# Patient Record
Sex: Male | Born: 1965 | Race: White | Hispanic: No | Marital: Married | Smoking: Never smoker
Health system: Southern US, Community
[De-identification: ages and names within clinical notes are randomized; demographics above are authoritative.]

## PROBLEM LIST (undated history)

## (undated) DIAGNOSIS — R011 Cardiac murmur, unspecified: Secondary | ICD-10-CM

## (undated) DIAGNOSIS — D126 Benign neoplasm of colon, unspecified: Secondary | ICD-10-CM

## (undated) DIAGNOSIS — M1990 Unspecified osteoarthritis, unspecified site: Secondary | ICD-10-CM

## (undated) DIAGNOSIS — Z87442 Personal history of urinary calculi: Secondary | ICD-10-CM

## (undated) DIAGNOSIS — N200 Calculus of kidney: Secondary | ICD-10-CM

## (undated) HISTORY — PX: POLYPECTOMY: SHX149

## (undated) HISTORY — PX: APPENDECTOMY: SHX54

## (undated) HISTORY — PX: WISDOM TOOTH EXTRACTION: SHX21

## (undated) HISTORY — PX: COLONOSCOPY: SHX174

## (undated) HISTORY — PX: KNEE ARTHROSCOPY: SUR90

## (undated) HISTORY — DX: Benign neoplasm of colon, unspecified: D12.6

## (undated) HISTORY — DX: Unspecified osteoarthritis, unspecified site: M19.90

## (undated) HISTORY — DX: Cardiac murmur, unspecified: R01.1

---

## 2004-12-29 ENCOUNTER — Encounter (INDEPENDENT_AMBULATORY_CARE_PROVIDER_SITE_OTHER): Payer: Self-pay | Admitting: General Surgery

## 2004-12-30 ENCOUNTER — Observation Stay (HOSPITAL_COMMUNITY): Admission: EM | Admit: 2004-12-30 | Discharge: 2004-12-31 | Payer: Self-pay | Admitting: Emergency Medicine

## 2008-01-03 ENCOUNTER — Emergency Department (HOSPITAL_COMMUNITY): Admission: EM | Admit: 2008-01-03 | Discharge: 2008-01-03 | Payer: Self-pay | Admitting: Emergency Medicine

## 2010-05-14 DIAGNOSIS — D126 Benign neoplasm of colon, unspecified: Secondary | ICD-10-CM

## 2010-05-14 HISTORY — DX: Benign neoplasm of colon, unspecified: D12.6

## 2010-07-24 ENCOUNTER — Encounter (INDEPENDENT_AMBULATORY_CARE_PROVIDER_SITE_OTHER): Payer: Self-pay | Admitting: *Deleted

## 2010-08-01 ENCOUNTER — Encounter (INDEPENDENT_AMBULATORY_CARE_PROVIDER_SITE_OTHER): Payer: Self-pay | Admitting: *Deleted

## 2010-08-01 NOTE — Letter (Signed)
Summary: Pre Visit Letter Revised  [facility]  [address] [place], [place] [address]   Phone: [phone]  Fax: [phone]        07/24/2010 MRN: [id number] [facility] [address] [place], [place]  [address]             Procedure Date:  September 04, 2010   dir col -Dr Jarold Motto   Welcome to the Gastroenterology Division at [facility].    You are scheduled to see a nurse for your pre-procedure visit on August 21, 2010 at 10:00am on the 3rd floor at [facility], [address]  We ask that you try to arrive at our office 15 minutes prior to your appointment time to allow for check-in.  Please take a minute to review the attached form.  If you answer "Yes" to one or more of the questions on the first page, we ask that you call the person listed at your earliest opportunity.  If you answer "No" to all of the questions, please complete the rest of the form and bring it to your appointment.    Your nurse visit will consist of discussing your medical and surgical history, your immediate family medical history, and your medications.   If you are unable to list all of your medications on the form, please bring the medication bottles to your appointment and we will list them.  We will need to be aware of both prescribed and over the counter drugs.  We will need to know exact dosage information as well.    Please be prepared to read and sign documents such as consent forms, a financial agreement, and acknowledgement forms.  If necessary, and with your consent, a friend or relative is welcome to sit-in on the nurse visit with you.  Please bring your insurance card so that we may make a copy of it.  If your insurance requires a referral to see a specialist, please bring your referral form from your primary care physician.  No co-pay is required for this nurse visit.     If you cannot keep your appointment, please call [phone] to cancel or reschedule prior to your  appointment date.  This allows [place] the opportunity to schedule an appointment for another patient in need of care.    Thank you for choosing [facility] for your medical needs.  We appreciate the opportunity to care for you.  Please visit [place] at our website  to learn more about our practice.  Sincerely, The Gastroenterology Division

## 2010-08-10 NOTE — Letter (Signed)
Summary: Pre Visit Letter Revised  [facility]  [address] [place], [place] [address]   Phone: [phone]  Fax: [phone]        08/01/2010 MRN: [id number] [facility] [address] [place], [place]  [address]             Procedure Date:  09-04-10           Direct Colon---Dr. Jarold Motto   Welcome to the Gastroenterology Division at [facility].    You are scheduled to see a nurse for your pre-procedure visit on 08-21-10 at 10:00a.m. on the 3rd floor at [facility], [address].  We ask that you try to arrive at our office 15 minutes prior to your appointment time to allow for check-in.  Please take a minute to review the attached form.  If you answer "Yes" to one or more of the questions on the first page, we ask that you call the person listed at your earliest opportunity.  If you answer "No" to all of the questions, please complete the rest of the form and bring it to your appointment.    Your nurse visit will consist of discussing your medical and surgical history, your immediate family medical history, and your medications.   If you are unable to list all of your medications on the form, please bring the medication bottles to your appointment and we will list them.  We will need to be aware of both prescribed and over the counter drugs.  We will need to know exact dosage information as well.    Please be prepared to read and sign documents such as consent forms, a financial agreement, and acknowledgement forms.  If necessary, and with your consent, a friend or relative is welcome to sit-in on the nurse visit with you.  Please bring your insurance card so that we may make a copy of it.  If your insurance requires a referral to see a specialist, please bring your referral form from your primary care physician.  No co-pay is required for this nurse visit.     If you cannot keep your appointment, please call [phone] to cancel or reschedule prior to  your appointment date.  This allows [place] the opportunity to schedule an appointment for another patient in need of care.    Thank you for choosing [facility] for your medical needs.  We appreciate the opportunity to care for you.  Please visit [place] at our website  to learn more about our practice.  Sincerely, The Gastroenterology Division

## 2010-08-10 NOTE — Letter (Signed)
Summary: Pre Visit Letter Revised  [facility]  [address] [place], [place] [address]   Phone: [phone]  Fax: [phone]        08/01/2010 MRN: [id number] [facility] [address] [place], [place]  [address]             Procedure Date: 09-04-10           Direct Colon---Dr. Jarold Motto   Welcome to the Gastroenterology Division at [facility].    You are scheduled to see a nurse for your pre-procedure visit on 08-21-10 at 10:00a.m. on the 3rd floor at [facility], [address].  We ask that you try to arrive at our office 15 minutes prior to your appointment time to allow for check-in.  Please take a minute to review the attached form.  If you answer "Yes" to one or more of the questions on the first page, we ask that you call the person listed at your earliest opportunity.  If you answer "No" to all of the questions, please complete the rest of the form and bring it to your appointment.    Your nurse visit will consist of discussing your medical and surgical history, your immediate family medical history, and your medications.   If you are unable to list all of your medications on the form, please bring the medication bottles to your appointment and we will list them.  We will need to be aware of both prescribed and over the counter drugs.  We will need to know exact dosage information as well.    Please be prepared to read and sign documents such as consent forms, a financial agreement, and acknowledgement forms.  If necessary, and with your consent, a friend or relative is welcome to sit-in on the nurse visit with you.  Please bring your insurance card so that we may make a copy of it.  If your insurance requires a referral to see a specialist, please bring your referral form from your primary care physician.  No co-pay is required for this nurse visit.     If you cannot keep your appointment, please call [phone] to cancel or reschedule prior to your  appointment date.  This allows [place] the opportunity to schedule an appointment for another patient in need of care.    Thank you for choosing [facility] for your medical needs.  We appreciate the opportunity to care for you.  Please visit [place] at our website  to learn more about our practice.  Sincerely, The Gastroenterology Division

## 2010-08-21 ENCOUNTER — Ambulatory Visit (AMBULATORY_SURGERY_CENTER): Payer: 59 | Admitting: *Deleted

## 2010-08-21 VITALS — Ht 70.0 in | Wt 214.6 lb

## 2010-08-21 DIAGNOSIS — Z1211 Encounter for screening for malignant neoplasm of colon: Secondary | ICD-10-CM

## 2010-08-21 MED ORDER — PEG-KCL-NACL-NASULF-NA ASC-C 100 G PO SOLR: ORAL | Status: DC

## 2010-09-01 ENCOUNTER — Encounter: Payer: Self-pay | Admitting: *Deleted

## 2010-09-04 ENCOUNTER — Encounter: Payer: Self-pay | Admitting: Gastroenterology

## 2010-09-04 ENCOUNTER — Ambulatory Visit (AMBULATORY_SURGERY_CENTER): Payer: 59 | Admitting: Gastroenterology

## 2010-09-04 DIAGNOSIS — D126 Benign neoplasm of colon, unspecified: Secondary | ICD-10-CM

## 2010-09-04 DIAGNOSIS — Z800 Family history of malignant neoplasm of digestive organs: Secondary | ICD-10-CM

## 2010-09-04 DIAGNOSIS — Z1211 Encounter for screening for malignant neoplasm of colon: Secondary | ICD-10-CM

## 2010-09-04 MED ORDER — SODIUM CHLORIDE 0.9 % IV SOLN: 500.0000 mL | INTRAVENOUS | Status: DC

## 2010-09-04 NOTE — Patient Instructions (Signed)
Follow discharge instructions.  Continue your medications with EXCEPTION of NSAIDS for 2 weeks. You may take Tylenol for pain.  Repeat Colonoscopy in one year.

## 2010-09-05 ENCOUNTER — Telehealth: Payer: Self-pay | Admitting: *Deleted

## 2010-09-05 NOTE — Telephone Encounter (Signed)

## 2010-09-08 ENCOUNTER — Encounter: Payer: Self-pay | Admitting: Gastroenterology

## 2010-09-29 NOTE — Op Note (Signed)
Jackson Carpenter, Jackson Carpenter              ACCOUNT NO.:  [id number]   MEDICAL RECORD NO.:  [id number]          PATIENT TYPE:  OBV   LOCATION:  A308                          FACILITY:  [facility]   PHYSICIAN:  Dalia Heading, M.D.  DATE OF BIRTH:  Jun 26, 1965   DATE OF PROCEDURE:  12/30/2004  DATE OF DISCHARGE:                                 OPERATIVE REPORT   PREOPERATIVE DIAGNOSIS:  Acute appendicitis.   POSTOPERATIVE DIAGNOSIS:  Acute appendicitis.   PROCEDURE:  Laparoscopic appendectomy.   SURGEON:  Dalia Heading, M.D.   ANESTHESIA:  General endotracheal.   INDICATIONS:  The patient is a 45 year old white male who presents with  right lower quadrant abdominal pain.  A CT scan confirmed acute  appendicitis.  The risks and benefits of the procedure including bleeding,  infection, and the possibility of an open procedure were fully explained to  the patient, who gave informed consent.   PROCEDURE NOTE:  The patient was placed in supine position.  After induction  of general endotracheal anesthesia, the abdomen was prepped and draped using  the usual sterile technique with Betadine.  Surgical site confirmation was  performed.   The patient was placed in Trendelenburg position.  A supraumbilical incision  was made down to the fascia.  A Veress needle was introduced into the  abdominal cavity and confirmation of placement was done using the saline  drop test. The abdomen was then insufflated to 16 mmHg pressure. An 11-mm  trocar was introduced into the abdominal cavity under direct visualization  without difficulty.  An additional 12-mm trocar was placed in suprapubic  region and a 5-mm trocar was placed in the left lower quadrant region.  The  appendix was visualized and noted be acutely inflamed in the distal third.  The mesoappendix was divided using the harmonic scalpel and Endo clips.  An  Endo-GIA was placed across the base of the appendix and fired.  The appendix  was removed using  an EndoCatch bag. The right lower quadrant was inspected.  No abnormal bleeding or bile leak or spillage was noted.  The staple line  was noted be intact.  All fluid and air were then evacuated from the  abdominal cavity prior to removal of the trocars.   All wounds were irrigated with normal saline.  All wounds were injected with  0.5 cm Sensorcaine.  The supraumbilical fascia as well as suprapubic fascia  were reapproximated using O Vicryl interrupted sutures.  All skin incisions  were closed using staples. Betadine ointment and dry sterile dressings were  applied.   All tape and needle counts were correct and the end of the procedure.  The  patient was extubated in the operating room and went back to recovery room  awake and in stable condition.   COMPLICATIONS:  None.   SPECIMEN:  Appendix.   BLOOD LOSS:  Minimal.      Dalia Heading, M.D.  Electronically Signed     MAJ/MEDQ  D:  12/30/2004  T:  12/30/2004  Job:  [id number]   cc:   Kirk Ruths, M.D.  [address]  [place]  [place] [address]  Fax: [phone]

## 2011-08-08 ENCOUNTER — Encounter: Payer: Self-pay | Admitting: Gastroenterology

## 2011-08-13 ENCOUNTER — Encounter: Payer: Self-pay | Admitting: Gastroenterology

## 2011-09-10 ENCOUNTER — Encounter: Payer: Self-pay | Admitting: Gastroenterology

## 2011-09-10 ENCOUNTER — Ambulatory Visit (AMBULATORY_SURGERY_CENTER): Payer: BC Managed Care – PPO | Admitting: *Deleted

## 2011-09-10 VITALS — Ht 70.0 in | Wt 218.0 lb

## 2011-09-10 DIAGNOSIS — Z1211 Encounter for screening for malignant neoplasm of colon: Secondary | ICD-10-CM

## 2011-09-10 DIAGNOSIS — D126 Benign neoplasm of colon, unspecified: Secondary | ICD-10-CM

## 2011-09-10 MED ORDER — PEG-KCL-NACL-NASULF-NA ASC-C 100 G PO SOLR: ORAL | Status: DC

## 2011-09-24 ENCOUNTER — Encounter: Payer: Self-pay | Admitting: Gastroenterology

## 2011-09-24 ENCOUNTER — Ambulatory Visit (AMBULATORY_SURGERY_CENTER): Payer: BC Managed Care – PPO | Admitting: Gastroenterology

## 2011-09-24 VITALS — BP 144/76 | HR 64 | Temp 96.6°F | Resp 15 | Ht 70.0 in | Wt 218.0 lb

## 2011-09-24 DIAGNOSIS — Z1211 Encounter for screening for malignant neoplasm of colon: Secondary | ICD-10-CM

## 2011-09-24 DIAGNOSIS — D126 Benign neoplasm of colon, unspecified: Secondary | ICD-10-CM

## 2011-09-24 DIAGNOSIS — Z86010 Personal history of colonic polyps: Secondary | ICD-10-CM

## 2011-09-24 MED ORDER — SODIUM CHLORIDE 0.9 % IV SOLN: 500.0000 mL | INTRAVENOUS | Status: DC

## 2011-09-24 NOTE — Patient Instructions (Signed)
YOU HAD AN ENDOSCOPIC PROCEDURE TODAY AT THE [facility]: Refer to the procedure report that was given to you for any specific questions about what was found during the examination.  If the procedure report does not answer your questions, please call your gastroenterologist to clarify.  If you requested that your care partner not be given the details of your procedure findings, then the procedure report has been included in a sealed envelope for you to review at your convenience later.  YOU SHOULD EXPECT: Some feelings of bloating in the abdomen. Passage of more gas than usual.  Walking can help get rid of the air that was put into your GI tract during the procedure and reduce the bloating. If you had a lower endoscopy (such as a colonoscopy or flexible sigmoidoscopy) you may notice spotting of blood in your stool or on the toilet paper. If you underwent a bowel prep for your procedure, then you may not have a normal bowel movement for a few days.  DIET: Your first meal following the procedure should be a light meal and then it is ok to progress to your normal diet.  A half-sandwich or bowl of soup is an example of a good first meal.  Heavy or fried foods are harder to digest and may make you feel nauseous or bloated.  Likewise meals heavy in dairy and vegetables can cause extra gas to form and this can also increase the bloating.  Drink plenty of fluids but you should avoid alcoholic beverages for 24 hours.  ACTIVITY: Your care partner should take you home directly after the procedure.  You should plan to take it easy, moving slowly for the rest of the day.  You can resume normal activity the day after the procedure however you should NOT DRIVE or use heavy machinery for 24 hours (because of the sedation medicines used during the test).    SYMPTOMS TO REPORT IMMEDIATELY: A gastroenterologist can be reached at any hour.  During normal business hours, 8:30 AM to 5:00 PM Monday through Friday,  call [phone].  After hours and on weekends, please call the GI answering service at [phone] who will take a message and have the physician on call contact you.   Following lower endoscopy (colonoscopy or flexible sigmoidoscopy):  Excessive amounts of blood in the stool  Significant tenderness or worsening of abdominal pains  Swelling of the abdomen that is new, acute  Fever of 100F or higher  Following upper endoscopy (EGD)  Vomiting of blood or coffee ground material  New chest pain or pain under the shoulder blades  Painful or persistently difficult swallowing  New shortness of breath  Fever of 100F or higher  Black, tarry-looking stools  FOLLOW UP: If any biopsies were taken you will be contacted by phone or by letter within the next 1-3 weeks.  Call your gastroenterologist if you have not heard about the biopsies in 3 weeks.  Our staff will call the home number listed on your records the next business day following your procedure to check on you and address any questions or concerns that you may have at that time regarding the information given to you following your procedure. This is a courtesy call and so if there is no answer at the home number and we have not heard from you through the emergency physician on call, we will assume that you have returned to your regular daily activities without incident.  SIGNATURES/CONFIDENTIALITY: You and/or your care   partner have signed paperwork which will be entered into your electronic medical record.  These signatures attest to the fact that that the information above on your After Visit Summary has been reviewed and is understood.  Full responsibility of the confidentiality of this discharge information lies with you and/or your care-partner.   REPEAT COLONOSCOPY IN 5 YEARS 

## 2011-09-24 NOTE — Progress Notes (Signed)
Patient did not experience any of the following events: a burn prior to discharge; a fall within the facility; wrong site/side/patient/procedure/implant event; or a hospital transfer or hospital admission upon discharge from the facility. (G8907) Patient did not have preoperative order for IV antibiotic SSI prophylaxis. (G8918)  

## 2011-09-24 NOTE — Progress Notes (Signed)
Pressure applied to the abdomen during withdrawal of scope for better visualization.

## 2011-09-24 NOTE — Op Note (Signed)
[facility] [address] [place], [place]  [address]  COLONOSCOPY PROCEDURE REPORT  PATIENT:  Jackson Carpenter, Jackson Carpenter  MR#:  [id number] BIRTHDATE:  03-19-1966, 45 yrs. old  GENDER:  male ENDOSCOPIST:  Vania Rea. Jarold Motto, MD, [facility] REF. BY: PROCEDURE DATE:  09/24/2011 PROCEDURE:  Diagnostic Colonoscopy ASA CLASS:  Class II INDICATIONS:  history of pre-cancerous (adenomatous) colon polyps large right colon polyp removed 1 year ago. MEDICATIONS:   propofol (Diprivan) 300 mg IV  DESCRIPTION OF PROCEDURE:   After the risks and benefits and of the procedure were explained, informed consent was obtained. Digital rectal exam was performed and revealed no abnormalities. The LB CF-[id number] endoscope was introduced through the anus and advanced to the cecum, which was identified by both the appendix and ileocecal valve.  The quality of the prep was excellent, using MoviPrep.  The instrument was then slowly withdrawn as the colon was fully examined. <<PROCEDUREIMAGES>>  FINDINGS:  No polyps or cancers were seen.  This was otherwise a normal examination of the colon. WELL PREPPED RIGHT COLON APPEARS NORMAL,,,   Retroflexed views in the rectum revealed no abnormalities.    The scope was then withdrawn from the patient and the procedure completed.  COMPLICATIONS:  None ENDOSCOPIC IMPRESSION: 1) No polyps or cancers 2) Otherwise normal examination RECOMMENDATIONS: 1) Repeat Colonoscopy in 5 years.  REPEAT EXAM:  No  ______________________________ Vania Rea. Jarold Motto, MD, Clement[facility] Graham  CC:  Karleen Hampshire, MD  n. Rosalie DoctorMarland Kitchen   Vania Rea. Laurelai Lepp at 09/24/2011 09:44 AM  Isabel Caprice, [phone]

## 2011-09-25 ENCOUNTER — Telehealth: Payer: Self-pay | Admitting: *Deleted

## 2011-09-25 NOTE — Telephone Encounter (Signed)
  Follow up Call-  Call back number 09/24/2011 09/04/2010  Post procedure Call Back phone  # [phone]  Permission to leave phone message Yes -     Patient questions:  Do you have a fever, pain , or abdominal swelling? no Pain Score  0 *  Have you tolerated food without any problems? yes  Have you been able to return to your normal activities? yes  Do you have any questions about your discharge instructions: Diet   no Medications  no Follow up visit  no  Do you have questions or concerns about your Care? no  Actions: * If pain score is 4 or above: No action needed, pain <4.

## 2014-03-22 ENCOUNTER — Other Ambulatory Visit (HOSPITAL_COMMUNITY): Payer: Self-pay | Admitting: Nurse Practitioner

## 2014-03-22 ENCOUNTER — Ambulatory Visit (HOSPITAL_COMMUNITY)
Admission: RE | Admit: 2014-03-22 | Discharge: 2014-03-22 | Disposition: A | Discharge status: Home / Self Care | Payer: BC Managed Care – PPO | Source: Ambulatory Visit | Attending: Nurse Practitioner | Admitting: Nurse Practitioner

## 2014-03-22 DIAGNOSIS — M545 Low back pain, unspecified: Secondary | ICD-10-CM

## 2014-03-22 DIAGNOSIS — M79604 Pain in right leg: Secondary | ICD-10-CM

## 2015-11-20 ENCOUNTER — Emergency Department (HOSPITAL_COMMUNITY): Payer: Worker's Compensation

## 2015-11-20 ENCOUNTER — Encounter (HOSPITAL_COMMUNITY): Payer: Self-pay | Admitting: *Deleted

## 2015-11-20 ENCOUNTER — Emergency Department (HOSPITAL_COMMUNITY)
Admission: EM | Admit: 2015-11-20 | Discharge: 2015-11-20 | Disposition: A | Discharge status: Home / Self Care | Payer: Worker's Compensation | Attending: Emergency Medicine | Admitting: Emergency Medicine

## 2015-11-20 DIAGNOSIS — Z7982 Long term (current) use of aspirin: Secondary | ICD-10-CM | POA: Insufficient documentation

## 2015-11-20 DIAGNOSIS — M549 Dorsalgia, unspecified: Secondary | ICD-10-CM | POA: Diagnosis present

## 2015-11-20 DIAGNOSIS — S20221A Contusion of right back wall of thorax, initial encounter: Secondary | ICD-10-CM | POA: Diagnosis not present

## 2015-11-20 DIAGNOSIS — Y9389 Activity, other specified: Secondary | ICD-10-CM | POA: Insufficient documentation

## 2015-11-20 DIAGNOSIS — S39011D Strain of muscle, fascia and tendon of abdomen, subsequent encounter: Secondary | ICD-10-CM | POA: Insufficient documentation

## 2015-11-20 DIAGNOSIS — S20211A Contusion of right front wall of thorax, initial encounter: Secondary | ICD-10-CM

## 2015-11-20 DIAGNOSIS — S4991XA Unspecified injury of right shoulder and upper arm, initial encounter: Secondary | ICD-10-CM

## 2015-11-20 DIAGNOSIS — S46901A Unspecified injury of unspecified muscle, fascia and tendon at shoulder and upper arm level, right arm, initial encounter: Secondary | ICD-10-CM | POA: Diagnosis not present

## 2015-11-20 DIAGNOSIS — Y929 Unspecified place or not applicable: Secondary | ICD-10-CM | POA: Insufficient documentation

## 2015-11-20 DIAGNOSIS — W1839XA Other fall on same level, initial encounter: Secondary | ICD-10-CM | POA: Insufficient documentation

## 2015-11-20 DIAGNOSIS — Y999 Unspecified external cause status: Secondary | ICD-10-CM | POA: Insufficient documentation

## 2015-11-20 DIAGNOSIS — F17220 Nicotine dependence, chewing tobacco, uncomplicated: Secondary | ICD-10-CM | POA: Insufficient documentation

## 2015-11-20 DIAGNOSIS — S76211D Strain of adductor muscle, fascia and tendon of right thigh, subsequent encounter: Secondary | ICD-10-CM

## 2015-11-20 MED ORDER — ACETAMINOPHEN 325 MG PO TABS
650.0000 mg | ORAL_TABLET | Freq: Once | ORAL | Status: AC
  Administered 2015-11-20: 650 mg via ORAL
  Filled 2015-11-20: qty 2

## 2015-11-20 MED ORDER — OXYCODONE-ACETAMINOPHEN 5-325 MG PO TABS: 1.0000 | ORAL_TABLET | ORAL | Status: DC | PRN

## 2015-11-20 NOTE — ED Notes (Signed)
Upon assessment, pt is having pain in his right leg as well.

## 2015-11-20 NOTE — ED Provider Notes (Signed)
CSN: [id number]     Arrival date & time 11/20/15  1445 History   First MD Initiated Contact with Patient 11/20/15 1500     Chief Complaint  Patient presents with  . Shoulder Pain     (Consider location/radiation/quality/duration/timing/severity/associated sxs/prior Treatment) HPI  50 year old male presents with back pain on the right side as well as right thigh pain after being knocked over by a horse. He was helping a friend put a horse up on one of them charged at him and knocked him over. He did not get trampled or stomped on. Patient states he hit the back of his head when he fell but denies any loss of consciousness and has no headache. No neck pain or stiffness. No nausea or vomiting or blurry vision. He's having pain in between his shoulder blade and midline back on the right. Also having right thigh/groin pain. Denies chest or abdominal pain. Rates his pain as a 6/10, declines pain medicine at this time.  Past Medical History  Diagnosis Date  . Heart murmur   . Adenomatous colon polyp 2012   Past Surgical History  Procedure Laterality Date  . Appendectomy    . Knee arthroscopy      left  . Wisdom tooth extraction     Family History  Problem Relation Age of Onset  . Colon cancer Mother   . Colon polyps Father    Social History  Substance Use Topics  . Smoking status: Never Smoker   . Smokeless tobacco: Current User    Types: Chew  . Alcohol Use: No    Review of Systems  Respiratory: Negative for shortness of breath.   Cardiovascular: Negative for chest pain.  Gastrointestinal: Negative for abdominal pain.  Musculoskeletal: Positive for back pain and arthralgias. Negative for neck pain.  Neurological: Negative for dizziness, syncope, weakness, numbness and headaches.  All other systems reviewed and are negative.     Allergies  Review of patient's allergies indicates no known allergies.  Home Medications   Prior to Admission medications   Medication Sig  Start Date End Date Taking? Authorizing Provider  Aspirin-Acetaminophen-Caffeine (GOODY HEADACHE PO) Take by mouth. As needed for headache     Historical Provider, MD  ibuprofen (ADVIL,MOTRIN) 200 MG tablet Take 400 mg by mouth every 6 (six) hours as needed.    Historical Provider, MD  naproxen sodium (ANAPROX) 220 MG tablet Take 220 mg by mouth as needed.    Historical Provider, MD   BP 135/90 mmHg  Pulse 74  Temp(Src) 97.9 F (36.6 C) (Oral)  Resp 16  SpO2 98% Physical Exam  Constitutional: He is oriented to person, place, and time. He appears well-developed and well-nourished. Cervical collar in place.  HENT:  Head: Normocephalic and atraumatic.  Right Ear: External ear normal.  Left Ear: External ear normal.  Nose: Nose normal.  No obvious head injury noted  Eyes: EOM are normal. Right eye exhibits no discharge. Left eye exhibits no discharge.  Neck: Neck supple. No spinous process tenderness and no muscular tenderness present.  Cardiovascular: Normal rate, regular rhythm, normal heart sounds and intact distal pulses.   Pulses:      Radial pulses are 2+ on the right side, and 2+ on the left side.       Dorsalis pedis pulses are 2+ on the right side, and 2+ on the left side.  Pulmonary/Chest: Effort normal and breath sounds normal.  Abdominal: Soft. There is no tenderness.  Musculoskeletal: He exhibits no edema.  Right hip: He exhibits tenderness. He exhibits normal range of motion.       Cervical back: He exhibits no tenderness.       Thoracic back: He exhibits tenderness. He exhibits no bony tenderness.       Lumbar back: He exhibits no tenderness.       Back:       Right upper leg: He exhibits tenderness (proximal).  Has tenderness over right hip and proximal thigh. However, he can bend knee and hip. Significant pain in thigh when trying to straight leg raise  Neurological: He is alert and oriented to person, place, and time.  Skin: Skin is warm and dry.  Nursing note  and vitals reviewed.   ED Course  Procedures (including critical care time) Labs Review Labs Reviewed - No data to display  Imaging Review Dg Ribs Unilateral W/chest Right  11/20/2015  CLINICAL DATA:  Fall, right rib pain EXAM: RIGHT RIBS AND CHEST - 3+ VIEW COMPARISON:  None. FINDINGS: Lungs are clear.  No pleural effusion or pneumothorax. The heart is normal in size. No displaced right rib fracture is seen. Notably, the right scapula appears intact on these radiographs. IMPRESSION: No evidence of acute cardiopulmonary disease. No displaced right rib fracture is seen. Right scapula appears intact. Electronically Signed   By: Julian Hy M.D.   On: 11/20/2015 17:16   Dg Thoracic Spine 2 View  11/20/2015  CLINICAL DATA:  Back pain, knocked over by horse EXAM: THORACIC SPINE 2 VIEWS COMPARISON:  None. FINDINGS: Normal thoracic kyphosis. No evidence of fracture or dislocation. Vertebral body heights are maintained. Moderate multilevel degenerative changes. Visualized lungs are clear. IMPRESSION: No fracture or dislocation is seen. Electronically Signed   By: Julian Hy M.D.   On: 11/20/2015 16:08   Dg Scapula Right  11/20/2015  ADDENDUM REPORT: 11/20/2015 17:21 ADDENDUM: This patient has a subsequent chest/right rib series study performed. On that study, the right scapula is well visualized and appears intact. As such, the appearance on this study is likely related to artifact. This could be confirmed by assessing for point tenderness over the scapula. ADDENDED IMPRESSION: Lucency overlying the medial right scapular is favored to reflect artifact rather than a fracture. Correlate for point tenderness. No evidence of fracture or dislocation. Electronically Signed   By: Julian Hy M.D.   On: 11/20/2015 17:21  11/20/2015  CLINICAL DATA:  Right posterior shoulder/ scapular pain, knocked over by horse EXAM: RIGHT SCAPULA - 2+ VIEWS COMPARISON:  None. FINDINGS: Mildly comminuted fracture  involving the medial scapula, predominantly vertical in orientation. The visualized soft tissues are unremarkable. Visualized right lung is clear. IMPRESSION: Mildly comminuted medial scapular fracture, as above. Electronically Signed: By: Julian Hy M.D. On: 11/20/2015 16:10   Dg Hip Unilat With Pelvis 2-3 Views Right  11/20/2015  CLINICAL DATA:  Right hip pain, knocked over by horse EXAM: DG HIP (WITH OR WITHOUT PELVIS) 2-3V RIGHT COMPARISON:  None. FINDINGS: No fracture or dislocation is seen. Bilobed hip joint spaces are preserved. Visualized bony pelvis appears intact. Lower lumbar spine is unremarkable. Surgical clips overlying the right lower pelvis/iliac crest. IMPRESSION: No fracture or dislocation is seen. Electronically Signed   By: Julian Hy M.D.   On: 11/20/2015 16:10   Dg Femur, Min 2 Views Right  11/20/2015  CLINICAL DATA:  Right hip pain, knocked over by horse EXAM: RIGHT FEMUR 2 VIEWS COMPARISON:  None. FINDINGS: No fracture or dislocation is seen. The joint spaces are preserved.  Visualized soft tissues are within normal limits. IMPRESSION: No fracture or dislocation is seen. Electronically Signed   By: Julian Hy M.D.   On: 11/20/2015 16:11   I have personally reviewed and evaluated these images and lab results as part of my medical decision-making.   EKG Interpretation None      MDM   Final diagnoses:  Injury of right scapular region, initial encounter  Groin strain, right, subsequent encounter  Rib contusion, right, initial encounter    Patient x-rays show possible scapula fracture but then second x-ray of his ribs axis shows no scapula fracture. Patient is point tender over his scapula on reexamination with a bruise and thus will treat conservatively with a sling. Discussed with Dr. Lorin Mercy of Ortho who will follow-up the patient and outpatient setting. No other signs of severe injury and he has no abdominal tenderness. No obvious rib fracture. Will  discharge home with pain control and recommend follow-up with ortho. No hip fracture, and he is able to ambulate without assistance.    Sherwood Gambler, MD 11/21/15 0005

## 2015-11-20 NOTE — ED Notes (Signed)
MD at bedside. 

## 2015-11-20 NOTE — Discharge Instructions (Signed)
Your Xrays show a possible fracture of your Scapula (shoulder blade) but also look like it might not be broken. Wear the sling just in case and follow up with the orthopedic surgeon in 1 week. Take ibuprofen, tylenol and use ice on the painful areas.

## 2015-11-20 NOTE — ED Notes (Signed)
Pt come in with right posterior shoulder pain. 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 This caused him to fall back on his right shoulder and posterior head. Pt is alert and oriented upon arrival. Pt was log rolled with 2 person assist and denied any tenderness in his back or neck.

## 2016-05-14 HISTORY — PX: COLONOSCOPY: SHX174

## 2016-08-09 ENCOUNTER — Encounter: Payer: Self-pay | Admitting: Gastroenterology

## 2016-09-17 ENCOUNTER — Encounter: Payer: Self-pay | Admitting: Gastroenterology

## 2016-10-05 ENCOUNTER — Ambulatory Visit (AMBULATORY_SURGERY_CENTER): Payer: Self-pay | Admitting: *Deleted

## 2016-10-05 VITALS — Ht 70.0 in | Wt 232.0 lb

## 2016-10-05 DIAGNOSIS — Z800 Family history of malignant neoplasm of digestive organs: Secondary | ICD-10-CM

## 2016-10-05 DIAGNOSIS — Z86010 Personal history of colonic polyps: Secondary | ICD-10-CM

## 2016-10-05 MED ORDER — NA SULFATE-K SULFATE-MG SULF 17.5-3.13-1.6 GM/177ML PO SOLN: 1.0000 | Freq: Once | ORAL | 0 refills | Status: AC

## 2016-10-05 NOTE — Progress Notes (Signed)
No egg or soy allergy known to patient  No issues with past sedation with any surgeries  or procedures, no intubation problems  No diet pills per patient No home 02 use per patient  No blood thinners per patient  Pt denies issues with constipation  No A fib or A flutter  EMMI video declined  15 $ coupon to pt for suprep

## 2016-10-16 ENCOUNTER — Encounter: Payer: Self-pay | Admitting: Gastroenterology

## 2016-10-26 ENCOUNTER — Encounter: Payer: Self-pay | Admitting: Gastroenterology

## 2016-10-26 ENCOUNTER — Ambulatory Visit (AMBULATORY_SURGERY_CENTER): Payer: BLUE CROSS/BLUE SHIELD | Admitting: Gastroenterology

## 2016-10-26 VITALS — BP 110/56 | HR 53 | Temp 98.6°F | Resp 16 | Ht 70.0 in | Wt 232.0 lb

## 2016-10-26 DIAGNOSIS — Z86010 Personal history of colonic polyps: Secondary | ICD-10-CM

## 2016-10-26 DIAGNOSIS — D122 Benign neoplasm of ascending colon: Secondary | ICD-10-CM | POA: Diagnosis not present

## 2016-10-26 DIAGNOSIS — D123 Benign neoplasm of transverse colon: Secondary | ICD-10-CM | POA: Diagnosis not present

## 2016-10-26 MED ORDER — SODIUM CHLORIDE 0.9 % IV SOLN: 500.0000 mL | INTRAVENOUS | Status: AC

## 2016-10-26 NOTE — Op Note (Signed)
[facility] Patient Name: Jackson Carpenter Procedure Date: 10/26/2016 9:15 AM MRN: [id number] Endoscopist: Jackson Lipps P. Ryo Klang MD, MD Age: 51 Referring MD:  Date of Birth: 1965/10/16 Gender: Male Account #: [id number] Procedure:                Colonoscopy Indications:              Surveillance: Personal history of adenomatous                            polyps on last colonoscopy > 3 years ago, mother                            with colon cancer diagnosed age 63s Medicines:                Monitored Anesthesia Care Procedure:                Pre-Anesthesia Assessment:                           - Prior to the procedure, a History and Physical                            was performed, and patient medications and                            allergies were reviewed. The patient's tolerance of                            previous anesthesia was also reviewed. The risks                            and benefits of the procedure and the sedation                            options and risks were discussed with the patient.                            All questions were answered, and informed consent                            was obtained. Prior Anticoagulants: The patient has                            taken no previous anticoagulant or antiplatelet                            agents. ASA Grade Assessment: II - A patient with                            mild systemic disease. After reviewing the risks                            and benefits, the patient was deemed in  satisfactory condition to undergo the procedure.                           After obtaining informed consent, the colonoscope                            was passed under direct vision. Throughout the                            procedure, the patient's blood pressure, pulse, and                            oxygen saturations were monitored continuously. The                            Colonoscope was  introduced through the anus and                            advanced to the the terminal ileum, with                            identification of the appendiceal orifice and IC                            valve. The colonoscopy was performed without                            difficulty. The patient tolerated the procedure                            well. The quality of the bowel preparation was                            good. The terminal ileum, ileocecal valve,                            appendiceal orifice, and rectum were photographed. Scope In: 9:16:58 AM Scope Out: 9:38:01 AM Scope Withdrawal Time: 0 hours 18 minutes 15 seconds  Total Procedure Duration: 0 hours 21 minutes 3 seconds  Findings:                 The perianal and digital rectal examinations were                            normal.                           A 5 mm polyp was found in the ascending colon. The                            polyp was sessile. The polyp was removed with a                            cold snare. Resection and retrieval were complete.  Five sessile polyps were found in the transverse                            colon. The polyps were 3 to 5 mm in size. These                            polyps were removed with a cold snare. Resection                            and retrieval were complete.                           The terminal ileum appeared normal.                           The exam was otherwise without abnormality on                            direct and retroflexion views. Complications:            No immediate complications. Estimated blood loss:                            Minimal. Estimated Blood Loss:     Estimated blood loss was minimal. Impression:               - One 5 mm polyp in the ascending colon, removed                            with a cold snare. Resected and retrieved.                           - Five 3 to 5 mm polyps in the transverse colon,                             removed with a cold snare. Resected and retrieved.                           - The examined portion of the ileum was normal.                           - The examination was otherwise normal on direct                            and retroflexion views. Recommendation:           - Patient has a contact number available for                            emergencies. The signs and symptoms of potential                            delayed complications were discussed with the  patient. Return to normal activities tomorrow.                            Written discharge instructions were provided to the                            patient.                           - Resume previous diet.                           - Continue present medications.                           - Await pathology results.                           - Repeat colonoscopy is recommended for                            surveillance. The colonoscopy date will be                            determined after pathology results from today's                            exam become available for review.                           - No ibuprofen, naproxen, or other non-steroidal                            anti-inflammatory drugs for 2 weeks after polyp                            removal. Jackson Lipps P. Jackson Burdell MD, MD 10/26/2016 9:42:32 AM This report has been signed electronically.

## 2016-10-26 NOTE — Patient Instructions (Signed)
YOU HAD AN ENDOSCOPIC PROCEDURE TODAY AT [facility]:   Refer to the procedure report that was given to you for any specific questions about what was found during the examination.  If the procedure report does not answer your questions, please call your gastroenterologist to clarify.  If you requested that your care partner not be given the details of your procedure findings, then the procedure report has been included in a sealed envelope for you to review at your convenience later.  YOU SHOULD EXPECT: Some feelings of bloating in the abdomen. Passage of more gas than usual.  Walking can help get rid of the air that was put into your GI tract during the procedure and reduce the bloating. If you had a lower endoscopy (such as a colonoscopy or flexible sigmoidoscopy) you may notice spotting of blood in your stool or on the toilet paper. If you underwent a bowel prep for your procedure, you may not have a normal bowel movement for a few days.  Please Note:  You might notice some irritation and congestion in your nose or some drainage.  This is from the oxygen used during your procedure.  There is no need for concern and it should clear up in a day or so.  SYMPTOMS TO REPORT IMMEDIATELY:   Following lower endoscopy (colonoscopy or flexible sigmoidoscopy):  Excessive amounts of blood in the stool  Significant tenderness or worsening of abdominal pains  Swelling of the abdomen that is new, acute  Fever of 100F or higher   For urgent or emergent issues, a gastroenterologist can be reached at any hour by calling [phone].   DIET:  We do recommend a small meal at first, but then you may proceed to your regular diet.  Drink plenty of fluids but you should avoid alcoholic beverages for 24 hours.  ACTIVITY:  You should plan to take it easy for the rest of today and you should NOT DRIVE or use heavy machinery until tomorrow (because of the sedation medicines used during the test).     FOLLOW UP: Our staff will call the number listed on your records the next business day following your procedure to check on you and address any questions or concerns that you may have regarding the information given to you following your procedure. If we do not reach you, we will leave a message.  However, if you are feeling well and you are not experiencing any problems, there is no need to return our call.  We will assume that you have returned to your regular daily activities without incident.  If any biopsies were taken you will be contacted by phone or by letter within the next 1-3 weeks.  Please call us at [phone] if you have not heard about the biopsies in 3 weeks.   \ SIGNATURES/CONFIDENTIALITY: You and/or your care partner have signed paperwork which will be entered into your electronic medical record.  These signatures attest to the fact that that the information above on your After Visit Summary has been reviewed and is understood.  Full responsibility of the confidentiality of this discharge information lies with you and/or your care-partner.  Polyp information given.  NO  NSAIDS (IBUPROFEN, ADVIL, ALEVE, AND MOTRIN) FOR 2 weeks; TYLENOL IS OK TO TAKE

## 2016-10-26 NOTE — Progress Notes (Signed)
Called to room to assist during endoscopic procedure.  Patient ID and intended procedure confirmed with present staff. Received instructions for my participation in the procedure from the performing physician.  

## 2016-10-26 NOTE — Progress Notes (Signed)
Alert and oriented x3, pleased with MAC, report to RN jane 

## 2016-10-26 NOTE — Progress Notes (Signed)
Pt's states no medical or surgical changes since previsit or office visit. 

## 2016-10-29 ENCOUNTER — Telehealth: Payer: Self-pay | Admitting: *Deleted

## 2016-10-29 NOTE — Telephone Encounter (Signed)
  Follow up Call-  Call back number 10/26/2016  Post procedure Call Back phone  # [phone]  Permission to leave phone message Yes  Some recent data might be hidden     Patient questions:  Do you have a fever, pain , or abdominal swelling? No. Pain Score  0 *  Have you tolerated food without any problems? Yes.    Have you been able to return to your normal activities? Yes.    Do you have any questions about your discharge instructions: Diet   No. Medications  No. Follow up visit  No.  Do you have questions or concerns about your Care? No.  Actions: * If pain score is 4 or above: No action needed, pain <4.

## 2016-11-02 ENCOUNTER — Encounter: Payer: Self-pay | Admitting: Gastroenterology

## 2016-12-16 ENCOUNTER — Emergency Department (HOSPITAL_COMMUNITY)
Admission: EM | Admit: 2016-12-16 | Discharge: 2016-12-16 | Disposition: A | Discharge status: Home / Self Care | Payer: Commercial Managed Care - PPO | Attending: Emergency Medicine | Admitting: Emergency Medicine

## 2016-12-16 ENCOUNTER — Emergency Department (HOSPITAL_COMMUNITY): Payer: Commercial Managed Care - PPO

## 2016-12-16 ENCOUNTER — Encounter (HOSPITAL_COMMUNITY): Payer: Self-pay | Admitting: *Deleted

## 2016-12-16 DIAGNOSIS — N201 Calculus of ureter: Secondary | ICD-10-CM | POA: Insufficient documentation

## 2016-12-16 DIAGNOSIS — R1032 Left lower quadrant pain: Secondary | ICD-10-CM | POA: Diagnosis present

## 2016-12-16 DIAGNOSIS — F17290 Nicotine dependence, other tobacco product, uncomplicated: Secondary | ICD-10-CM | POA: Insufficient documentation

## 2016-12-16 DIAGNOSIS — R911 Solitary pulmonary nodule: Secondary | ICD-10-CM | POA: Insufficient documentation

## 2016-12-16 LAB — CBC
HCT: 40.8 % (ref 39.0–52.0)
HEMOGLOBIN: 14.1 g/dL (ref 13.0–17.0)
MCH: 30.5 pg (ref 26.0–34.0)
MCHC: 34.6 g/dL (ref 30.0–36.0)
MCV: 88.1 fL (ref 78.0–100.0)
PLATELETS: 155 10*3/uL (ref 150–400)
RBC: 4.63 MIL/uL (ref 4.22–5.81)
RDW: 13.2 % (ref 11.5–15.5)
WBC: 6.7 10*3/uL (ref 4.0–10.5)

## 2016-12-16 LAB — COMPREHENSIVE METABOLIC PANEL
ALK PHOS: 77 U/L (ref 38–126)
ALT: 62 U/L (ref 17–63)
AST: 42 U/L — ABNORMAL HIGH (ref 15–41)
Albumin: 3.9 g/dL (ref 3.5–5.0)
Anion gap: 7 (ref 5–15)
BUN: 10 mg/dL (ref 6–20)
CALCIUM: 9.2 mg/dL (ref 8.9–10.3)
CO2: 25 mmol/L (ref 22–32)
CREATININE: 1.06 mg/dL (ref 0.61–1.24)
Chloride: 108 mmol/L (ref 101–111)
Glucose, Bld: 118 mg/dL — ABNORMAL HIGH (ref 65–99)
Potassium: 3.8 mmol/L (ref 3.5–5.1)
SODIUM: 140 mmol/L (ref 135–145)
Total Bilirubin: 0.7 mg/dL (ref 0.3–1.2)
Total Protein: 6.7 g/dL (ref 6.5–8.1)

## 2016-12-16 LAB — LIPASE, BLOOD: LIPASE: 21 U/L (ref 11–51)

## 2016-12-16 MED ORDER — NAPROXEN 500 MG PO TABS: 500.0000 mg | ORAL_TABLET | Freq: Two times a day (BID) | ORAL | 0 refills | Status: DC

## 2016-12-16 MED ORDER — HYDROCODONE-ACETAMINOPHEN 5-325 MG PO TABS: 1.0000 | ORAL_TABLET | Freq: Four times a day (QID) | ORAL | 0 refills | Status: DC | PRN

## 2016-12-16 NOTE — ED Triage Notes (Addendum)
Pt c/o LLQ pain and nausea that started at about 1330 today. Pt reports the pain is now gone. Denies urinary symptoms, fever, n/v/d.

## 2016-12-16 NOTE — ED Notes (Signed)
Patient transported to CT 

## 2016-12-16 NOTE — ED Provider Notes (Signed)
[facility] DEPT Provider Note   CSN: [id number] Arrival date & time: 12/16/16  1349     History   Chief Complaint Chief Complaint  Patient presents with  . Abdominal Pain    HPI Jackson Carpenter is a 51 y.o. male.  Patient with acute onset around 1:00 of left flank pain very severe associated with nausea but no vomiting. Now just has a dull ache kind of in the left lower quadrant area.      Past Medical History:  Diagnosis Date  . Adenomatous colon polyp 2012  . Arthritis    all over  . Heart murmur     There are no active problems to display for this patient.   Past Surgical History:  Procedure Laterality Date  . APPENDECTOMY    . COLONOSCOPY    . KNEE ARTHROSCOPY     left  . POLYPECTOMY    . WISDOM TOOTH EXTRACTION         Home Medications    Prior to Admission medications   Medication Sig Start Date End Date Taking? Authorizing Provider  Aspirin-Acetaminophen-Caffeine (GOODY HEADACHE PO) Take by mouth. As needed for headache    Yes [provider]  HYDROcodone-acetaminophen (NORCO/VICODIN) 5-325 MG tablet Take 1-2 tablets by mouth every 6 (six) hours as needed. 12/16/16   Fredia Sorrow, MD  naproxen (NAPROSYN) 500 MG tablet Take 1 tablet (500 mg total) by mouth 2 (two) times daily. 12/16/16   Fredia Sorrow, MD    Family History Family History  Problem Relation Age of Onset  . Colon cancer Mother   . Colon polyps Father   . Esophageal cancer Neg Hx   . Rectal cancer Neg Hx   . Stomach cancer Neg Hx     Social History Social History  Substance Use Topics  . Smoking status: Never Smoker  . Smokeless tobacco: Current User    Types: Chew  . Alcohol use No     Allergies   Patient has no known allergies.   Review of Systems Review of Systems  Constitutional: Negative for fever.  HENT: Negative for congestion.   Eyes: Negative for redness.  Respiratory: Negative for shortness of breath.   Cardiovascular: Negative for  chest pain.  Gastrointestinal: Positive for abdominal pain.  Genitourinary: Positive for flank pain. Negative for dysuria and hematuria.  Musculoskeletal: Negative for back pain.  Skin: Negative.   Neurological: Negative for headaches.  Hematological: Does not bruise/bleed easily.  Psychiatric/Behavioral: Negative for confusion.     Physical Exam Updated Vital Signs BP 104/65   Pulse 60   Temp 98 F (36.7 C) (Oral)   Resp 16   Ht 1.778 m (5\' 10" )   Wt 104.3 kg (230 lb)   SpO2 97%   BMI 33.00 kg/m   Physical Exam  Constitutional: He is oriented to person, place, and time. He appears well-developed and well-nourished. No distress.  HENT:  Head: Normocephalic and atraumatic.  Mouth/Throat: Oropharynx is clear and moist.  Eyes: Pupils are equal, round, and reactive to light. Conjunctivae and EOM are normal.  Neck: Normal range of motion. Neck supple.  Cardiovascular: Normal rate.   Pulmonary/Chest: Effort normal and breath sounds normal.  Abdominal: Soft. Bowel sounds are normal. There is no tenderness.  Musculoskeletal: Normal range of motion.  Neurological: He is alert and oriented to person, place, and time. No cranial nerve deficit or sensory deficit. He exhibits normal muscle tone. Coordination normal.  Skin: Skin is warm. No rash noted.  Nursing note and vitals reviewed.    ED Treatments / Results  Labs (all labs ordered are listed, but only abnormal results are displayed) Labs Reviewed  COMPREHENSIVE METABOLIC PANEL - Abnormal; Notable for the following:       Result Value   Glucose, Bld 118 (*)    AST 42 (*)    All other components within normal limits  LIPASE, BLOOD  CBC  URINALYSIS, ROUTINE W REFLEX MICROSCOPIC    EKG  EKG Interpretation None       Radiology Ct Renal Stone Study  Result Date: 12/16/2016 CLINICAL DATA:  Flank pain. EXAM: CT ABDOMEN AND PELVIS WITHOUT CONTRAST TECHNIQUE: Multidetector CT imaging of the abdomen and pelvis was  performed following the standard protocol without IV contrast. COMPARISON:  CT abdomen dated 12/30/2004. FINDINGS: Lower chest: 5 mm nodule within the right lower lobe. Lung bases otherwise clear. Hepatobiliary: Liver is low in density indicating fatty infiltration. Gallbladder appears normal. No bile duct dilatation. Pancreas: Unremarkable. No pancreatic ductal dilatation or surrounding inflammatory changes. Spleen: Normal in size without focal abnormality. Adrenals/Urinary Tract: 3 mm stone within the distal left ureter causing mild left-sided hydronephrosis. Additional 2 mm left renal stone. 4 mm nonobstructing right renal stone. Bladder is unremarkable, partially decompressed. Stomach/Bowel: Bowel is normal in caliber. No bowel wall thickening or evidence of bowel wall inflammation seen. Status post appendectomy. Stomach is unremarkable. Vascular/Lymphatic: No significant vascular findings are present. No enlarged abdominal or pelvic lymph nodes. Reproductive: Prostate is unremarkable. Other: No abdominal wall hernia or abnormality. No abdominopelvic ascites. Musculoskeletal: Mild degenerative change in the lower lumbar spine. No acute or suspicious osseous finding. IMPRESSION: 1. 3 mm stone within the distal left ureter, just proximal to the left UVJ, causing mild left-sided hydronephrosis. 2. Bilateral nephrolithiasis. 3. 5 mm pulmonary nodule within the right lower lobe. No follow-up needed if patient is low-risk. Non-contrast chest CT can be considered in 12 months if patient is high-risk. This recommendation follows the consensus statement: Guidelines for Management of Incidental Pulmonary Nodules Detected on CT Images: From the Fleischner Society 2017; Radiology 2017; 284:228-243. 4. Fatty infiltration of the liver. Electronically Signed   By: Franki Cabot M.D.   On: 12/16/2016 17:46    Procedures Procedures (including critical care time)  Medications Ordered in ED Medications - No data to  display   Initial Impression / Assessment and Plan / ED Course  I have reviewed the triage vital signs and the nursing notes.  Pertinent labs & imaging results that were available during my care of the patient were reviewed by me and considered in my medical decision making (see chart for details).     Clinical history concerning for possible left-sided kidney stone. CT scan confirmed a distal ureteral stone measuring 3 mm. Patient clinically without any significant pain at this time. Will be treated with Naprosyn and hydrocodone for the pain. Follow-up urology. Patient's renal function was normal.  Final Clinical Impressions(s) / ED Diagnoses   Final diagnoses:  Left ureteral stone  Pulmonary nodule    New Prescriptions New Prescriptions   HYDROCODONE-ACETAMINOPHEN (NORCO/VICODIN) 5-325 MG TABLET    Take 1-2 tablets by mouth every 6 (six) hours as needed.   NAPROXEN (NAPROSYN) 500 MG TABLET    Take 1 tablet (500 mg total) by mouth 2 (two) times daily.     Fredia Sorrow, MD 12/16/16 [phone]

## 2016-12-16 NOTE — Discharge Instructions (Signed)
CT scan did show evidence of a left-sided kidney stone. Take the Naprosyn as directed and supplement with hydrocodone as needed for additional pain relief. We expect you to past kidney stone in the next 24 hours. Return for any new or worse symptoms. In addition CT scan did show evidence of a lung nodule that will require some follow-up by your primary care doctor. Make an appointment in the next several weeks for recheck of this. In addition make an appoint with a follow-up with urology here in [place].

## 2017-01-17 ENCOUNTER — Emergency Department (HOSPITAL_COMMUNITY): Payer: Commercial Managed Care - PPO

## 2017-01-17 ENCOUNTER — Emergency Department (HOSPITAL_COMMUNITY)
Admission: EM | Admit: 2017-01-17 | Discharge: 2017-01-17 | Disposition: A | Discharge status: Home / Self Care | Payer: Commercial Managed Care - PPO | Attending: Emergency Medicine | Admitting: Emergency Medicine

## 2017-01-17 ENCOUNTER — Encounter (HOSPITAL_COMMUNITY): Payer: Self-pay | Admitting: Emergency Medicine

## 2017-01-17 DIAGNOSIS — N201 Calculus of ureter: Secondary | ICD-10-CM | POA: Insufficient documentation

## 2017-01-17 DIAGNOSIS — R1032 Left lower quadrant pain: Secondary | ICD-10-CM | POA: Diagnosis present

## 2017-01-17 DIAGNOSIS — Z79899 Other long term (current) drug therapy: Secondary | ICD-10-CM | POA: Diagnosis not present

## 2017-01-17 DIAGNOSIS — N23 Unspecified renal colic: Secondary | ICD-10-CM

## 2017-01-17 DIAGNOSIS — R109 Unspecified abdominal pain: Secondary | ICD-10-CM

## 2017-01-17 DIAGNOSIS — F17220 Nicotine dependence, chewing tobacco, uncomplicated: Secondary | ICD-10-CM | POA: Insufficient documentation

## 2017-01-17 HISTORY — DX: Calculus of kidney: N20.0

## 2017-01-17 LAB — URINALYSIS, ROUTINE W REFLEX MICROSCOPIC
Bacteria, UA: NONE SEEN
Bilirubin Urine: NEGATIVE
GLUCOSE, UA: NEGATIVE mg/dL
KETONES UR: 5 mg/dL — AB
Leukocytes, UA: NEGATIVE
Nitrite: NEGATIVE
PH: 6 (ref 5.0–8.0)
Protein, ur: NEGATIVE mg/dL
SPECIFIC GRAVITY, URINE: 1.016 (ref 1.005–1.030)
SQUAMOUS EPITHELIAL / LPF: NONE SEEN

## 2017-01-17 LAB — I-STAT CHEM 8, ED
BUN: 12 mg/dL (ref 6–20)
CHLORIDE: 104 mmol/L (ref 101–111)
CREATININE: 1.5 mg/dL — AB (ref 0.61–1.24)
Calcium, Ion: 1.24 mmol/L (ref 1.15–1.40)
GLUCOSE: 126 mg/dL — AB (ref 65–99)
HEMATOCRIT: 40 % (ref 39.0–52.0)
HEMOGLOBIN: 13.6 g/dL (ref 13.0–17.0)
POTASSIUM: 3.8 mmol/L (ref 3.5–5.1)
Sodium: 142 mmol/L (ref 135–145)
TCO2: 24 mmol/L (ref 22–32)

## 2017-01-17 MED ORDER — ONDANSETRON 8 MG PO TBDP
8.0000 mg | ORAL_TABLET | Freq: Once | ORAL | Status: AC
  Administered 2017-01-17: 8 mg via ORAL
  Filled 2017-01-17: qty 1

## 2017-01-17 MED ORDER — HYDROMORPHONE HCL 1 MG/ML IJ SOLN
1.0000 mg | Freq: Once | INTRAMUSCULAR | Status: AC
  Administered 2017-01-17: 1 mg via INTRAMUSCULAR
  Filled 2017-01-17: qty 1

## 2017-01-17 MED ORDER — OXYCODONE-ACETAMINOPHEN 5-325 MG PO TABS
2.0000 | ORAL_TABLET | Freq: Once | ORAL | Status: AC
  Administered 2017-01-17: 2 via ORAL
  Filled 2017-01-17: qty 2

## 2017-01-17 MED ORDER — ONDANSETRON 4 MG PO TBDP: 4.0000 mg | ORAL_TABLET | Freq: Three times a day (TID) | ORAL | 0 refills | Status: DC | PRN

## 2017-01-17 MED ORDER — TAMSULOSIN HCL 0.4 MG PO CAPS: 0.4000 mg | ORAL_CAPSULE | Freq: Every day | ORAL | 0 refills | Status: DC

## 2017-01-17 MED ORDER — TAMSULOSIN HCL 0.4 MG PO CAPS
0.4000 mg | ORAL_CAPSULE | Freq: Once | ORAL | Status: AC
  Administered 2017-01-17: 0.4 mg via ORAL
  Filled 2017-01-17: qty 1

## 2017-01-17 MED ORDER — HYDROMORPHONE HCL 2 MG/ML IJ SOLN
INTRAMUSCULAR | Status: AC
  Filled 2017-01-17: qty 1

## 2017-01-17 MED ORDER — OXYCODONE-ACETAMINOPHEN 5-325 MG PO TABS: ORAL_TABLET | ORAL | 0 refills | Status: DC

## 2017-01-17 MED ORDER — HYDROMORPHONE HCL 2 MG/ML IJ SOLN
2.0000 mg | Freq: Once | INTRAMUSCULAR | Status: AC
  Administered 2017-01-17: 2 mg via INTRAMUSCULAR

## 2017-01-17 NOTE — Discharge Instructions (Signed)
Take the prescriptions as directed. Your renal function tests were mildly elevated today. Keep yourself hydrated and have them rechecked by either your family doctor or the Urologist within the next week.  Call the Urologist tomorrow to schedule a follow up appointment this week.  Return to the Emergency Department immediately if worsening.

## 2017-01-17 NOTE — ED Provider Notes (Signed)
[facility] DEPT Provider Note   CSN: [id number] Arrival date & time: 01/17/17  1902     History   Chief Complaint Chief Complaint  Patient presents with  . Flank Pain    HPI Jackson Carpenter is a 51 y.o. male.  HPI  Pt was seen at 1910. Per pt, c/o sudden onset and persistence of constant left sided flank "pain" that began this afternoon approximately 1730 PTA.  Pt describes the pain as "like my last kidney stone," and radiating into the left side of his abd.  Has been associated with multiple intermittent episodes of N/V.  Denies testicular pain/swelling, no dysuria/hematuria, no abd pain, no diarrhea, no black or blood in emesis, no CP/SOB, no fevers, no rash.    Past Medical History:  Diagnosis Date  . Adenomatous colon polyp 2012  . Arthritis    all over  . Heart murmur   . Kidney stone     There are no active problems to display for this patient.   Past Surgical History:  Procedure Laterality Date  . APPENDECTOMY    . COLONOSCOPY    . KNEE ARTHROSCOPY     left  . POLYPECTOMY    . WISDOM TOOTH EXTRACTION         Home Medications    Prior to Admission medications   Medication Sig Start Date End Date Taking? Authorizing Provider  Aspirin-Acetaminophen-Caffeine (GOODY HEADACHE PO) Take by mouth. As needed for headache     [provider]  HYDROcodone-acetaminophen (NORCO/VICODIN) 5-325 MG tablet Take 1-2 tablets by mouth every 6 (six) hours as needed. 12/16/16   Fredia Sorrow, MD  naproxen (NAPROSYN) 500 MG tablet Take 1 tablet (500 mg total) by mouth 2 (two) times daily. 12/16/16   Fredia Sorrow, MD    Family History Family History  Problem Relation Age of Onset  . Colon cancer Mother   . Colon polyps Father   . Esophageal cancer Neg Hx   . Rectal cancer Neg Hx   . Stomach cancer Neg Hx     Social History Social History  Substance Use Topics  . Smoking status: Never Smoker  . Smokeless tobacco: Current User    Types: Chew  .  Alcohol use No     Allergies   Patient has no known allergies.   Review of Systems Review of Systems ROS: Statement: All systems negative except as marked or noted in the HPI; Constitutional: Negative for fever and chills. ; ; Eyes: Negative for eye pain, redness and discharge. ; ; ENMT: Negative for ear pain, hoarseness, nasal congestion, sinus pressure and sore throat. ; ; Cardiovascular: Negative for chest pain, palpitations, diaphoresis, dyspnea and peripheral edema. ; ; Respiratory: Negative for cough, wheezing and stridor. ; ; Gastrointestinal: +N/V. Negative for diarrhea, abdominal pain, blood in stool, hematemesis, jaundice and rectal bleeding. ; ; Genitourinary: +flank pain. Negative for dysuria and hematuria. ; ; Genital:  No penile drainage or rash, no testicular pain or swelling, no scrotal rash or swelling. ;; Musculoskeletal: Negative for neck pain. Negative for swelling and trauma.; ; Skin: Negative for pruritus, rash, abrasions, blisters, bruising and skin lesion.; ; Neuro: Negative for headache, lightheadedness and neck stiffness. Negative for weakness, altered level of consciousness, altered mental status, extremity weakness, paresthesias, involuntary movement, seizure and syncope.       Physical Exam Updated Vital Signs BP (!) 163/117 (BP Location: Right Arm)   Pulse 72   Temp (!) 97.2 F (36.2 C) (Oral)  Resp 20   Ht 5\' 10"  (1.778 m)   Wt 104.3 kg (230 lb)   SpO2 96%   BMI 33.00 kg/m   Physical Exam 1915: Physical examination:  Nursing notes reviewed; Vital signs and O2 SAT reviewed;  Constitutional: Well developed, Well nourished, Well hydrated, Uncomfortable appearing.; Head:  Normocephalic, atraumatic; Eyes: EOMI, PERRL, No scleral icterus; ENMT: Mouth and pharynx normal, Mucous membranes moist; Neck: Supple, Full range of motion, No lymphadenopathy; Cardiovascular: Regular rate and rhythm, No gallop; Respiratory: Breath sounds clear & equal bilaterally, No  wheezes.  Speaking full sentences with ease, Normal respiratory effort/excursion; Chest: Nontender, Movement normal; Abdomen: Soft, Nontender, Nondistended, Normal bowel sounds; Genitourinary: No CVA tenderness; Spine:  No midline CS, TS, LS tenderness.;; Extremities: Pulses normal, No tenderness, No edema, No calf edema or asymmetry.; Neuro: AA&Ox3, Major CN grossly intact.  Speech clear. No gross focal motor or sensory deficits in extremities. Climbs on and off stretcher easily by himself. Gait steady.; Skin: Color normal, Warm, Dry.   ED Treatments / Results  Labs (all labs ordered are listed, but only abnormal results are displayed)   EKG  EKG Interpretation None       Radiology   Procedures Procedures (including critical care time)  Medications Ordered in ED Medications  ondansetron (ZOFRAN-ODT) disintegrating tablet 8 mg (not administered)  HYDROmorphone (DILAUDID) injection 1 mg (not administered)     Initial Impression / Assessment and Plan / ED Course  I have reviewed the triage vital signs and the nursing notes.  Pertinent labs & imaging results that were available during my care of the patient were reviewed by me and considered in my medical decision making (see chart for details).  MDM Reviewed: previous chart, nursing note and vitals Interpretation: CT scan and labs   Results for orders placed or performed during the hospital encounter of 01/17/17  Urinalysis, Routine w reflex microscopic- may I&O cath if menses  Result Value Ref Range   Color, Urine YELLOW YELLOW   APPearance CLEAR CLEAR   Specific Gravity, Urine 1.016 1.005 - 1.030   pH 6.0 5.0 - 8.0   Glucose, UA NEGATIVE NEGATIVE mg/dL   Hgb urine dipstick LARGE (A) NEGATIVE   Bilirubin Urine NEGATIVE NEGATIVE   Ketones, ur 5 (A) NEGATIVE mg/dL   Protein, ur NEGATIVE NEGATIVE mg/dL   Nitrite NEGATIVE NEGATIVE   Leukocytes, UA NEGATIVE NEGATIVE   RBC / HPF TOO NUMEROUS TO COUNT 0 - 5 RBC/hpf   WBC,  UA 0-5 0 - 5 WBC/hpf   Bacteria, UA NONE SEEN NONE SEEN   Squamous Epithelial / LPF NONE SEEN NONE SEEN   Mucus PRESENT   I-stat Chem 8, ED  Result Value Ref Range   Sodium 142 135 - 145 mmol/L   Potassium 3.8 3.5 - 5.1 mmol/L   Chloride 104 101 - 111 mmol/L   BUN 12 6 - 20 mg/dL   Creatinine, Ser 1.50 (H) 0.61 - 1.24 mg/dL   Glucose, Bld 126 (H) 65 - 99 mg/dL   Calcium, Ion 1.24 1.15 - 1.40 mmol/L   TCO2 24 22 - 32 mmol/L   Hemoglobin 13.6 13.0 - 17.0 g/dL   HCT 40.0 39.0 - 52.0 %   Ct Renal Stone Study Result Date: 01/17/2017 CLINICAL DATA:  Left flank pain for 2 days. History of kidney stone. EXAM: CT ABDOMEN AND PELVIS WITHOUT CONTRAST TECHNIQUE: Multidetector CT imaging of the abdomen and pelvis was performed following the standard protocol without IV contrast. COMPARISON:  CT  12/16/2016 FINDINGS: Lower chest: Unchanged 5 mm right lower lobe pulmonary nodule from recent exam. Hypoventilatory changes at the lung bases. Hepatobiliary: The liver is enlarged with diffusely decreased density consistent with steatosis. Fatty sparing adjacent gallbladder fossa. Gallbladder physiologically distended, no calcified stone. No biliary dilatation. Pancreas: No ductal dilatation or inflammation. Spleen: Enlarged spanning 14.4 cm cranial caudal. Splenule medially and anteriorly. Adrenals/Urinary Tract: Obstructing 5 x 5 mm stone at the left ureterovesicular junction with mild hydroureteronephrosis and perinephric edema. There are additional nonobstructing stones in both kidneys. No right hydronephrosis. Urinary bladder is decompressed. The adrenal glands are normal. Stomach/Bowel: Stomach distended with ingested contents. No bowel obstruction, inflammation or wall thickening. Post appendectomy with surgical clips at the base of the cecum. Vascular/Lymphatic: Normal caliber abdominal aorta. Small retroperitoneal nodes, not enlarged by size criteria. Reproductive: Prostate is unremarkable. Other: Fat within  both inguinal canals. No free air, free fluid, or intra-abdominal fluid collection. Musculoskeletal: There are no acute or suspicious osseous abnormalities. IMPRESSION: 1. Obstructing 5 mm stone in the left ureterovesicular junction with mild hydronephrosis. 2. Bilateral nonobstructing renal calculi. 3. Hepatic steatosis.  Hepatosplenomegaly. 4. Unchanged 5 mm right lower lobe pulmonary nodule from exam last month. Follow-up recommendations as before. No follow-up needed if patient is low risk. Noncontrast chest CT can be considered in 12 months if patient is high risk. Electronically Signed   By: Jeb Levering M.D.   On: 01/17/2017 21:03    2210:  CT scan now with new ureteral stone, no increase in hydronephrosis. Pt and wife already know about pulmonary nodule and have scheduled f/u appt with PMD for same. Pt's pain is controlled and he wants to go home now. Mild BUN/Cr elevation compared to last month. Pt states he has Uro MD follow up in 2 months (Nov); given this is pt's 2nd stone in 1 month period of time, will try to obtain sooner f/u.  /C to Uro Dr. Karsten Ro, case discussed, including:  HPI, pertinent PM/SHx, VS/PE, dx testing, ED course and treatment:  BUN/Cr likely d/t N/V/dehydration, agrees pt needs sooner f/u that 2 months, have pt call ofc tomorrow to schedule. Dx and testing, as well as d/w Uro MD, d/w pt and family.  Questions answered.  Verb understanding, agreeable to d/c home with outpt f/u.   Final Clinical Impressions(s) / ED Diagnoses   Final diagnoses:  None    New Prescriptions New Prescriptions   No medications on file     Francine Graven, DO 01/21/17 1538

## 2017-01-17 NOTE — ED Triage Notes (Signed)
Pt c/o left flank pain that radiates to groin x 2 hours. Pt states he has urinated very little since pain started.

## 2017-01-19 LAB — URINE CULTURE: Culture: NO GROWTH

## 2017-04-07 ENCOUNTER — Encounter (HOSPITAL_COMMUNITY): Payer: Self-pay | Admitting: Emergency Medicine

## 2017-04-07 ENCOUNTER — Emergency Department (HOSPITAL_COMMUNITY): Payer: Commercial Managed Care - PPO

## 2017-04-07 ENCOUNTER — Other Ambulatory Visit: Payer: Self-pay

## 2017-04-07 ENCOUNTER — Emergency Department (HOSPITAL_COMMUNITY)
Admission: EM | Admit: 2017-04-07 | Discharge: 2017-04-07 | Disposition: A | Discharge status: Home / Self Care | Payer: Commercial Managed Care - PPO | Attending: Emergency Medicine | Admitting: Emergency Medicine

## 2017-04-07 DIAGNOSIS — J40 Bronchitis, not specified as acute or chronic: Secondary | ICD-10-CM

## 2017-04-07 DIAGNOSIS — J189 Pneumonia, unspecified organism: Secondary | ICD-10-CM

## 2017-04-07 DIAGNOSIS — J181 Lobar pneumonia, unspecified organism: Secondary | ICD-10-CM | POA: Diagnosis not present

## 2017-04-07 DIAGNOSIS — Z79899 Other long term (current) drug therapy: Secondary | ICD-10-CM | POA: Diagnosis not present

## 2017-04-07 DIAGNOSIS — R05 Cough: Secondary | ICD-10-CM | POA: Diagnosis present

## 2017-04-07 LAB — COMPREHENSIVE METABOLIC PANEL
ALBUMIN: 4 g/dL (ref 3.5–5.0)
ALK PHOS: 89 U/L (ref 38–126)
ALT: 81 U/L — AB (ref 17–63)
ANION GAP: 8 (ref 5–15)
AST: 59 U/L — AB (ref 15–41)
BILIRUBIN TOTAL: 0.4 mg/dL (ref 0.3–1.2)
BUN: 7 mg/dL (ref 6–20)
CALCIUM: 9 mg/dL (ref 8.9–10.3)
CO2: 24 mmol/L (ref 22–32)
CREATININE: 1.01 mg/dL (ref 0.61–1.24)
Chloride: 104 mmol/L (ref 101–111)
GFR calc Af Amer: >60 mL/min (ref >60)
GFR calc non Af Amer: >60 mL/min (ref >60)
GLUCOSE: 122 mg/dL — AB (ref 65–99)
Potassium: 3.9 mmol/L (ref 3.5–5.1)
SODIUM: 136 mmol/L (ref 135–145)
TOTAL PROTEIN: 7.3 g/dL (ref 6.5–8.1)

## 2017-04-07 LAB — CBC WITH DIFFERENTIAL/PLATELET
BASOS ABS: 0 10*3/uL (ref 0.0–0.1)
BASOS PCT: 0 %
EOS PCT: 0 %
Eosinophils Absolute: 0 10*3/uL (ref 0.0–0.7)
HEMATOCRIT: 43 % (ref 39.0–52.0)
Hemoglobin: 14.6 g/dL (ref 13.0–17.0)
Lymphocytes Relative: 9 %
Lymphs Abs: 0.7 10*3/uL (ref 0.7–4.0)
MCH: 29.8 pg (ref 26.0–34.0)
MCHC: 34 g/dL (ref 30.0–36.0)
MCV: 87.8 fL (ref 78.0–100.0)
MONO ABS: 0.9 10*3/uL (ref 0.1–1.0)
MONOS PCT: 11 %
Neutro Abs: 6.5 10*3/uL (ref 1.7–7.7)
Neutrophils Relative %: 80 %
PLATELETS: 140 10*3/uL — AB (ref 150–400)
RBC: 4.9 MIL/uL (ref 4.22–5.81)
RDW: 13.4 % (ref 11.5–15.5)
WBC: 8.1 10*3/uL (ref 4.0–10.5)

## 2017-04-07 MED ORDER — PREDNISONE 20 MG PO TABS: 40.0000 mg | ORAL_TABLET | Freq: Every day | ORAL | 0 refills | Status: DC

## 2017-04-07 MED ORDER — DEXAMETHASONE SODIUM PHOSPHATE 10 MG/ML IJ SOLN
10.0000 mg | Freq: Once | INTRAMUSCULAR | Status: AC
  Administered 2017-04-07: 10 mg via INTRAMUSCULAR
  Filled 2017-04-07: qty 1

## 2017-04-07 MED ORDER — ALBUTEROL SULFATE (2.5 MG/3ML) 0.083% IN NEBU
5.0000 mg | INHALATION_SOLUTION | Freq: Once | RESPIRATORY_TRACT | Status: AC
  Administered 2017-04-07: 5 mg via RESPIRATORY_TRACT
  Filled 2017-04-07: qty 6

## 2017-04-07 MED ORDER — ACETAMINOPHEN 325 MG PO TABS
650.0000 mg | ORAL_TABLET | Freq: Once | ORAL | Status: AC
  Administered 2017-04-07: 650 mg via ORAL
  Filled 2017-04-07: qty 2

## 2017-04-07 MED ORDER — AZITHROMYCIN 250 MG PO TABS: 250.0000 mg | ORAL_TABLET | Freq: Every day | ORAL | 0 refills | Status: AC

## 2017-04-07 MED ORDER — LORAZEPAM 0.5 MG PO TABS
0.5000 mg | ORAL_TABLET | Freq: Once | ORAL | Status: AC
  Administered 2017-04-07: 0.5 mg via ORAL
  Filled 2017-04-07: qty 1

## 2017-04-07 MED ORDER — DEXTROSE 5 % IV SOLN
500.0000 mg | Freq: Once | INTRAVENOUS | Status: AC
  Administered 2017-04-07: 500 mg via INTRAVENOUS
  Filled 2017-04-07: qty 500

## 2017-04-07 NOTE — Discharge Instructions (Signed)
As discussed, your evaluation today has been largely reassuring.  But, it is important that you monitor your condition carefully, and do not hesitate to return to the ED if you develop new, or concerning changes in your condition. ? ?Otherwise, please follow-up with your physician for appropriate ongoing care. ? ?

## 2017-04-07 NOTE — ED Provider Notes (Signed)
[facility] EMERGENCY DEPARTMENT Provider Note   CSN: [id number] Arrival date & time: 04/07/17  5852     History   Chief Complaint Chief Complaint  Patient presents with  . Cough    HPI Lynkin Saini [place] is a 51 y.o. male.  HPI Presents with cough, and difficulty with expelling mucus/dyspnea. Onset was about 3 days ago, since onset symptoms have been progressive, with throat discomfort, upper chest discomfort, primarily with coughing. No relief with OTC medication. No fever, no chills, no confusion, no disorientation, no syncope. Patient is here with a male companion who assists with the HPI. He notes that he does not smoke, but has used dipping tobacco for decades.  Past Medical History:  Diagnosis Date  . Adenomatous colon polyp 2012  . Arthritis    all over  . Heart murmur   . Kidney stone     There are no active problems to display for this patient.   Past Surgical History:  Procedure Laterality Date  . APPENDECTOMY    . COLONOSCOPY    . KNEE ARTHROSCOPY     left  . POLYPECTOMY    . WISDOM TOOTH EXTRACTION         Home Medications    Prior to Admission medications   Medication Sig Start Date End Date Taking? Authorizing Provider  Aspirin-Acetaminophen-Caffeine (GOODY HEADACHE PO) Take by mouth. As needed for headache     [provider]  HYDROcodone-acetaminophen (NORCO/VICODIN) 5-325 MG tablet Take 1-2 tablets by mouth every 6 (six) hours as needed. 12/16/16   Fredia Sorrow, MD  ondansetron (ZOFRAN ODT) 4 MG disintegrating tablet Take 1 tablet (4 mg total) by mouth every 8 (eight) hours as needed for nausea or vomiting. 01/17/17   Francine Graven, DO  oxyCODONE-acetaminophen (PERCOCET/ROXICET) 5-325 MG tablet 1 or 2 tabs PO q6h prn pain 01/17/17   Francine Graven, DO  tamsulosin (FLOMAX) 0.4 MG CAPS capsule Take 1 capsule (0.4 mg total) by mouth at bedtime. 01/17/17   Francine Graven, DO    Family History Family History  Problem  Relation Age of Onset  . Colon cancer Mother   . Colon polyps Father   . Esophageal cancer Neg Hx   . Rectal cancer Neg Hx   . Stomach cancer Neg Hx     Social History Social History   Tobacco Use  . Smoking status: Never Smoker  . Smokeless tobacco: Current User    Types: Chew  Substance Use Topics  . Alcohol use: No  . Drug use: No     Allergies   Patient has no known allergies.   Review of Systems Review of Systems  Constitutional:       Per HPI, otherwise negative  HENT:       Per HPI, otherwise negative  Respiratory:       Per HPI, otherwise negative  Cardiovascular:       Per HPI, otherwise negative  Gastrointestinal: Negative for vomiting.  Endocrine:       Negative aside from HPI  Genitourinary:       Neg aside from HPI   Musculoskeletal:       Per HPI, otherwise negative  Skin: Negative.   Neurological: Negative for syncope.     Physical Exam Updated Vital Signs BP 133/77 (BP Location: Right Arm)   Pulse (!) 102   Temp 99.8 F (37.7 C) (Oral)   Resp (!) 22   Ht 5\' 10"  (1.778 m)   Wt 104.3 kg (230  lb)   SpO2 96%   BMI 33.00 kg/m   Physical Exam  Constitutional: He is oriented to person, place, and time. He appears well-developed. No distress.  HENT:  Head: Normocephalic and atraumatic.  Mouth/Throat: Oropharynx is clear and moist and mucous membranes are normal.  Eyes: Conjunctivae and EOM are normal.  Cardiovascular: Normal rate and regular rhythm.  Pulmonary/Chest: Effort normal. No stridor. No respiratory distress.  Abdominal: He exhibits no distension.  Musculoskeletal: He exhibits no edema.  Neurological: He is alert and oriented to person, place, and time.  Skin: Skin is warm and dry.  Psychiatric: His mood appears anxious.  Nursing note and vitals reviewed.    ED Treatments / Results  Labs (all labs ordered are listed, but only abnormal results are displayed) Labs Reviewed  COMPREHENSIVE METABOLIC PANEL - Abnormal;  Notable for the following components:      Result Value   Glucose, Bld 122 (*)    AST 59 (*)    ALT 81 (*)    All other components within normal limits  CBC WITH DIFFERENTIAL/PLATELET - Abnormal; Notable for the following components:   Platelets 140 (*)    All other components within normal limits   Radiology Dg Chest 2 View  Result Date: 04/07/2017 CLINICAL DATA:  Dyspnea. Productive cough and sore throat for 2 days. EXAM: CHEST  2 VIEW COMPARISON:  11/20/2015 FINDINGS: The cardiac silhouette is borderline to mildly enlarged. The lungs are hypoinflated with peribronchial thickening. Mildly asymmetric interstitial density is present in the left lung base. No pleural effusion or pneumothorax is identified. Thoracic spondylosis is noted. IMPRESSION: Peribronchial thickening suggesting bronchitis. Mild asymmetric left basilar density could reflect early pneumonia. Electronically Signed   By: Logan Bores M.D.   On: 04/07/2017 09:32    Procedures Procedures (including critical care time)  Medications Ordered in ED Medications  albuterol (PROVENTIL) (2.5 MG/3ML) 0.083% nebulizer solution 5 mg (5 mg Nebulization Given 04/07/17 0958)  dexamethasone (DECADRON) injection 10 mg (10 mg Intramuscular Given 04/07/17 0902)  LORazepam (ATIVAN) tablet 0.5 mg (0.5 mg Oral Given 04/07/17 0902)  azithromycin (ZITHROMAX) 500 mg in dextrose 5 % 250 mL IVPB (0 mg Intravenous Stopped 04/07/17 1142)  acetaminophen (TYLENOL) tablet 650 mg (650 mg Oral Given 04/07/17 1048)     Initial Impression / Assessment and Plan / ED Course  I have reviewed the triage vital signs and the nursing notes.  Pertinent labs & imaging results that were available during my care of the patient were reviewed by me and considered in my medical decision making (see chart for details).  12:05 PM Now, following antibiotics, fluids, Ativan, patient has no increased work of breathing, appears calm. He is hemodynamically  unremarkable. I discussed the findings again with him and multiple family members.  One was suspicion for bronchitis and/or pneumonia, given the abnormal x-ray, and his increased work of breathing, the patient will be discharged with both antibiotics and steroids. Patient will follow up with primary care.   Final Clinical Impressions(s) / ED Diagnoses  Community acquired pneumonia Bronchitis   Carmin Muskrat, MD 04/07/17 1206

## 2017-04-07 NOTE — ED Triage Notes (Addendum)
Patient brought in via EMS from home. Alert and oriented. Airway patent. Patient reports cough with fever, body aches, and sore throat x2 days. Patient woke this morning "feeling like throat was severely swollen and chokes on phlegm." Patient repirts taking 2 dose of from z-pack he had at home, left over from URI last month. Patient given albuterol neb treatment in route by EMS with some relief.

## 2017-04-07 NOTE — ED Notes (Signed)
Resp paged for breathing treatment.  

## 2017-04-24 ENCOUNTER — Ambulatory Visit (HOSPITAL_COMMUNITY)
Admission: RE | Admit: 2017-04-24 | Discharge: 2017-04-24 | Disposition: A | Discharge status: Home / Self Care | Payer: Commercial Managed Care - PPO | Source: Ambulatory Visit | Attending: Urology | Admitting: Urology

## 2017-04-24 ENCOUNTER — Ambulatory Visit (INDEPENDENT_AMBULATORY_CARE_PROVIDER_SITE_OTHER): Payer: Commercial Managed Care - PPO | Admitting: Urology

## 2017-04-24 ENCOUNTER — Other Ambulatory Visit: Payer: Self-pay | Admitting: Urology

## 2017-04-24 DIAGNOSIS — N202 Calculus of kidney with calculus of ureter: Secondary | ICD-10-CM | POA: Insufficient documentation

## 2017-04-24 DIAGNOSIS — N200 Calculus of kidney: Secondary | ICD-10-CM

## 2017-10-16 ENCOUNTER — Other Ambulatory Visit: Payer: Self-pay | Admitting: Urology

## 2017-10-16 DIAGNOSIS — N200 Calculus of kidney: Secondary | ICD-10-CM

## 2017-10-21 ENCOUNTER — Ambulatory Visit (HOSPITAL_COMMUNITY)
Admission: RE | Admit: 2017-10-21 | Discharge: 2017-10-21 | Disposition: A | Discharge status: Home / Self Care | Payer: Commercial Managed Care - PPO | Source: Ambulatory Visit | Attending: Urology | Admitting: Urology

## 2017-10-21 DIAGNOSIS — N200 Calculus of kidney: Secondary | ICD-10-CM | POA: Diagnosis present

## 2017-10-22 ENCOUNTER — Other Ambulatory Visit (HOSPITAL_COMMUNITY): Payer: Self-pay | Admitting: Pulmonary Disease

## 2017-10-22 DIAGNOSIS — R911 Solitary pulmonary nodule: Secondary | ICD-10-CM

## 2017-10-23 ENCOUNTER — Other Ambulatory Visit: Payer: Self-pay | Admitting: Urology

## 2017-10-23 ENCOUNTER — Ambulatory Visit (HOSPITAL_COMMUNITY)
Admission: RE | Admit: 2017-10-23 | Discharge: 2017-10-23 | Disposition: A | Discharge status: Home / Self Care | Payer: Commercial Managed Care - PPO | Source: Ambulatory Visit | Attending: Urology | Admitting: Urology

## 2017-10-23 ENCOUNTER — Ambulatory Visit (INDEPENDENT_AMBULATORY_CARE_PROVIDER_SITE_OTHER): Payer: Commercial Managed Care - PPO | Admitting: Urology

## 2017-10-23 DIAGNOSIS — N200 Calculus of kidney: Secondary | ICD-10-CM

## 2017-10-23 DIAGNOSIS — R52 Pain, unspecified: Secondary | ICD-10-CM

## 2017-10-24 ENCOUNTER — Encounter (HOSPITAL_COMMUNITY): Payer: Self-pay | Admitting: General Practice

## 2017-10-24 ENCOUNTER — Other Ambulatory Visit: Payer: Self-pay | Admitting: Urology

## 2017-10-28 ENCOUNTER — Encounter (HOSPITAL_COMMUNITY)
Admission: RE | Disposition: A | Discharge status: Home / Self Care | Payer: Self-pay | Source: Other Acute Inpatient Hospital | Attending: Urology

## 2017-10-28 ENCOUNTER — Ambulatory Visit (HOSPITAL_COMMUNITY): Payer: Commercial Managed Care - PPO

## 2017-10-28 ENCOUNTER — Ambulatory Visit (HOSPITAL_COMMUNITY)
Admission: RE | Admit: 2017-10-28 | Discharge: 2017-10-28 | Disposition: A | Discharge status: Home / Self Care | Payer: Commercial Managed Care - PPO | Source: Other Acute Inpatient Hospital | Attending: Urology | Admitting: Urology

## 2017-10-28 ENCOUNTER — Emergency Department (HOSPITAL_COMMUNITY)
Admission: EM | Admit: 2017-10-28 | Discharge: 2017-10-29 | Disposition: A | Discharge status: Home / Self Care | Payer: Commercial Managed Care - PPO | Source: Home / Self Care | Attending: Emergency Medicine | Admitting: Emergency Medicine

## 2017-10-28 ENCOUNTER — Encounter (HOSPITAL_COMMUNITY): Payer: Self-pay | Admitting: Emergency Medicine

## 2017-10-28 ENCOUNTER — Other Ambulatory Visit: Payer: Self-pay

## 2017-10-28 ENCOUNTER — Encounter (HOSPITAL_COMMUNITY): Payer: Self-pay | Admitting: General Practice

## 2017-10-28 DIAGNOSIS — N23 Unspecified renal colic: Secondary | ICD-10-CM

## 2017-10-28 DIAGNOSIS — N200 Calculus of kidney: Secondary | ICD-10-CM | POA: Diagnosis present

## 2017-10-28 DIAGNOSIS — Z720 Tobacco use: Secondary | ICD-10-CM | POA: Insufficient documentation

## 2017-10-28 DIAGNOSIS — Z6830 Body mass index (BMI) 30.0-30.9, adult: Secondary | ICD-10-CM | POA: Insufficient documentation

## 2017-10-28 DIAGNOSIS — E669 Obesity, unspecified: Secondary | ICD-10-CM | POA: Insufficient documentation

## 2017-10-28 HISTORY — DX: Personal history of urinary calculi: Z87.442

## 2017-10-28 HISTORY — PX: EXTRACORPOREAL SHOCK WAVE LITHOTRIPSY: SHX1557

## 2017-10-28 LAB — CBC WITH DIFFERENTIAL/PLATELET
Basophils Absolute: 0 10*3/uL (ref 0.0–0.1)
Basophils Relative: 0 %
EOS PCT: 0 %
Eosinophils Absolute: 0 10*3/uL (ref 0.0–0.7)
HCT: 42.5 % (ref 39.0–52.0)
Hemoglobin: 14.5 g/dL (ref 13.0–17.0)
LYMPHS ABS: 0.8 10*3/uL (ref 0.7–4.0)
LYMPHS PCT: 5 %
MCH: 30.4 pg (ref 26.0–34.0)
MCHC: 34.1 g/dL (ref 30.0–36.0)
MCV: 89.1 fL (ref 78.0–100.0)
Monocytes Absolute: 0.7 10*3/uL (ref 0.1–1.0)
Monocytes Relative: 5 %
Neutro Abs: 14.2 10*3/uL — ABNORMAL HIGH (ref 1.7–7.7)
Neutrophils Relative %: 90 %
PLATELETS: 171 10*3/uL (ref 150–400)
RBC: 4.77 MIL/uL (ref 4.22–5.81)
RDW: 13 % (ref 11.5–15.5)
WBC: 15.8 10*3/uL — AB (ref 4.0–10.5)

## 2017-10-28 LAB — COMPREHENSIVE METABOLIC PANEL
ALK PHOS: 90 U/L (ref 38–126)
ALT: 34 U/L (ref 17–63)
ANION GAP: 8 (ref 5–15)
AST: 26 U/L (ref 15–41)
Albumin: 4.6 g/dL (ref 3.5–5.0)
BUN: 11 mg/dL (ref 6–20)
CO2: 26 mmol/L (ref 22–32)
Calcium: 9.4 mg/dL (ref 8.9–10.3)
Chloride: 103 mmol/L (ref 101–111)
Creatinine, Ser: 1.31 mg/dL — ABNORMAL HIGH (ref 0.61–1.24)
GFR calc non Af Amer: >60 mL/min (ref >60)
Glucose, Bld: 144 mg/dL — ABNORMAL HIGH (ref 65–99)
Potassium: 3.8 mmol/L (ref 3.5–5.1)
SODIUM: 137 mmol/L (ref 135–145)
Total Bilirubin: 1 mg/dL (ref 0.3–1.2)
Total Protein: 7.9 g/dL (ref 6.5–8.1)

## 2017-10-28 SURGERY — LITHOTRIPSY, ESWL
Anesthesia: LOCAL | Laterality: Right

## 2017-10-28 MED ORDER — KETOROLAC TROMETHAMINE 30 MG/ML IJ SOLN
30.0000 mg | Freq: Once | INTRAMUSCULAR | Status: AC
  Administered 2017-10-28: 30 mg via INTRAVENOUS
  Filled 2017-10-28: qty 1

## 2017-10-28 MED ORDER — ONDANSETRON HCL 4 MG/2ML IJ SOLN
4.0000 mg | Freq: Once | INTRAMUSCULAR | Status: AC
  Administered 2017-10-28: 4 mg via INTRAVENOUS
  Filled 2017-10-28: qty 2

## 2017-10-28 MED ORDER — DIPHENHYDRAMINE HCL 25 MG PO CAPS
25.0000 mg | ORAL_CAPSULE | ORAL | Status: AC
  Administered 2017-10-28: 25 mg via ORAL
  Filled 2017-10-28: qty 1

## 2017-10-28 MED ORDER — CIPROFLOXACIN HCL 500 MG PO TABS
500.0000 mg | ORAL_TABLET | ORAL | Status: AC
  Administered 2017-10-28: 500 mg via ORAL
  Filled 2017-10-28: qty 1

## 2017-10-28 MED ORDER — DIAZEPAM 5 MG PO TABS
10.0000 mg | ORAL_TABLET | ORAL | Status: AC
  Administered 2017-10-28: 10 mg via ORAL
  Filled 2017-10-28: qty 2

## 2017-10-28 MED ORDER — TAMSULOSIN HCL 0.4 MG PO CAPS: 0.4000 mg | ORAL_CAPSULE | Freq: Every day | ORAL | 0 refills | Status: AC

## 2017-10-28 MED ORDER — ONDANSETRON 4 MG PO TBDP: 4.0000 mg | ORAL_TABLET | Freq: Three times a day (TID) | ORAL | 0 refills | Status: AC | PRN

## 2017-10-28 MED ORDER — SODIUM CHLORIDE 0.9 % IV SOLN
INTRAVENOUS | Status: DC
  Administered 2017-10-28: 10:00:00 via INTRAVENOUS

## 2017-10-28 MED ORDER — KETOROLAC TROMETHAMINE 10 MG PO TABS: 10.0000 mg | ORAL_TABLET | Freq: Four times a day (QID) | ORAL | 0 refills | Status: AC | PRN

## 2017-10-28 NOTE — ED Provider Notes (Signed)
Emergency Department Provider Note   I have reviewed the triage vital signs and the nursing notes.   HISTORY  Chief Complaint Post-op Problem   HPI Jackson Carpenter is a 52 y.o. male with PMH of kidney stones s/p lithotripsy with Dr. Alyson Ingles today for right ureterolithiasis since to the emergency department for evaluation of worsening renal colic with passage of small bits of kidney stone.  Patient reports no complications during the procedure today.  He returned home and began having significant, intermittent, right flank pain.  His wife gave the pain medication prescribed but the patient had vomiting.  She gave a half dose a little while later but when pain was not controlled they called the urologist on-call who advised ED presentation for pain management.  She denies any fevers.  He continues to have urine output.  Wife has collected some small pieces of material from the urine which appear to be small stones. Patient has had renal colic multiple times in the past and states this feels similar.   Past Medical History:  Diagnosis Date  . Adenomatous colon polyp 2012  . Arthritis    all over  . Heart murmur    history of  . History of kidney stones   . Kidney stone     There are no active problems to display for this patient.   Past Surgical History:  Procedure Laterality Date  . APPENDECTOMY    . COLONOSCOPY    . KNEE ARTHROSCOPY     left  . POLYPECTOMY    . WISDOM TOOTH EXTRACTION      Current Outpatient Rx  . Order #: [id number] Class: Historical Med  . Order #: [id number] Class: Print  . Order #: [id number] Class: Print  . Order #: [id number] Class: Print  . Order #: [id number] Class: Print    Allergies Patient has no known allergies.  Family History  Problem Relation Age of Onset  . Colon cancer Mother   . Colon polyps Father   . Esophageal cancer Neg Hx   . Rectal cancer Neg Hx   . Stomach cancer Neg Hx     Social History Social History   Tobacco Use    . Smoking status: Never Smoker  . Smokeless tobacco: Current User    Types: Chew  Substance Use Topics  . Alcohol use: No  . Drug use: No    Review of Systems  Constitutional: No fever/chills Eyes: No visual changes. ENT: No sore throat. Cardiovascular: Denies chest pain. Respiratory: Denies shortness of breath. Gastrointestinal: No abdominal pain.  No nausea, no vomiting.  No diarrhea.  No constipation. Genitourinary: Negative for dysuria. Positive right flank pain.  Musculoskeletal: Negative for back pain.  Skin: Negative for rash. Neurological: Negative for headaches, focal weakness or numbness.  10-point ROS otherwise negative.  ____________________________________________   PHYSICAL EXAM:  VITAL SIGNS: ED Triage Vitals  Enc Vitals Group     BP 10/28/17 2221 110/81     Pulse Rate 10/28/17 2221 61     Resp 10/28/17 2221 16     Temp 10/28/17 2221 98.1 F (36.7 C)     Temp Source 10/28/17 2221 Oral     SpO2 10/28/17 2221 99 %     Weight 10/28/17 2223 213 lb (96.6 kg)     Height 10/28/17 2223 5\' 10"  (1.778 m)     Pain Score 10/28/17 2221 10   Constitutional: Alert and oriented. Patient is shifting in bed and appears uncomfortable.  Eyes: Conjunctivae are normal. Head:  Atraumatic. Nose: No congestion/rhinnorhea. Mouth/Throat: Mucous membranes are moist.  Neck: No stridor.   Cardiovascular: Normal rate, regular rhythm. Good peripheral circulation. Grossly normal heart sounds.   Respiratory: Normal respiratory effort.  No retractions. Lungs CTAB. Gastrointestinal: Soft and nontender. No distention.  Musculoskeletal: No lower extremity tenderness nor edema. No gross deformities of extremities. Neurologic:  Normal speech and language. No gross focal neurologic deficits are appreciated.  Skin:  Skin is warm, dry and intact. No rash noted.  ____________________________________________   LABS (all labs ordered are listed, but only abnormal results are  displayed)  Labs Reviewed  COMPREHENSIVE METABOLIC PANEL - Abnormal; Notable for the following components:      Result Value   Glucose, Bld 144 (*)    Creatinine, Ser 1.31 (*)    All other components within normal limits  CBC WITH DIFFERENTIAL/PLATELET - Abnormal; Notable for the following components:   WBC 15.8 (*)    Neutro Abs 14.2 (*)    All other components within normal limits   ____________________________________________  RADIOLOGY  Dg Abd 1 View  Result Date: 10/28/2017 CLINICAL DATA:  Right lithotripsy EXAM: ABDOMEN - 1 VIEW COMPARISON:  10/23/2017 FINDINGS: Small calcification projects over the midpole of the right kidney measuring 5 mm. No additional suspicious calcifications. Nonobstructive bowel gas pattern. No organomegaly. IMPRESSION: Right nephrolithiasis. No evidence of bowel obstruction or free air. Electronically Signed   By: Rolm Baptise M.D.   On: 10/28/2017 09:27    ____________________________________________   PROCEDURES  Procedure(s) performed:   Procedures  None ____________________________________________   INITIAL IMPRESSION / ASSESSMENT AND PLAN / ED COURSE  Pertinent labs & imaging results that were available during my care of the patient were reviewed by me and considered in my medical decision making (see chart for details).  Patient presents to the emergency department for evaluation of severe right flank pain in the setting of lithotripsy today.  No fevers or chills.  The patient has no abdominal tenderness on exam.  Suspect the patient is having renal colic and attempting to pass stones from his lithotripsy rather than post-op complication.  Plan for screening labs, Toradol, Zofran, reassess.  If pain continues or worsens would consider additional imaging but will hold off for now.   11:41 PM Patient is feeling much better after Toradol and Zofran. Will defer CT imaging at this time as would expect some renal colic in this clinical  setting. Reviewed labs with show a leukocytosis which seems appropriate in the immediate post-op setting.   At this time, I do not feel there is any life-threatening condition present. I have reviewed and discussed all results (EKG, imaging, lab, urine as appropriate), exam findings with patient. I have reviewed nursing notes and appropriate previous records.  I feel the patient is safe to be discharged home without further emergent workup. Discussed usual and customary return precautions. Patient and family (if present) verbalize understanding and are comfortable with this plan.  Patient will follow-up with their primary care provider. If they do not have a primary care provider, information for follow-up has been provided to them. All questions have been answered.  ____________________________________________  FINAL CLINICAL IMPRESSION(S) / ED DIAGNOSES  Final diagnoses:  Renal colic     MEDICATIONS GIVEN DURING THIS VISIT:  Medications  ketorolac (TORADOL) 30 MG/ML injection 30 mg (30 mg Intravenous Given 10/28/17 2246)  ondansetron (ZOFRAN) injection 4 mg (4 mg Intravenous Given 10/28/17 2246)     NEW OUTPATIENT MEDICATIONS STARTED DURING THIS VISIT:  Discharge Medication List as of 10/28/2017 11:49 PM    START taking these medications   Details  ketorolac (TORADOL) 10 MG tablet Take 1 tablet (10 mg total) by mouth every 6 (six) hours as needed., Starting Mon 10/28/2017, Print    ondansetron (ZOFRAN ODT) 4 MG disintegrating tablet Take 1 tablet (4 mg total) by mouth every 8 (eight) hours as needed for nausea or vomiting., Starting Mon 10/28/2017, Print    tamsulosin (FLOMAX) 0.4 MG CAPS capsule Take 1 capsule (0.4 mg total) by mouth daily for 7 days., Starting Mon 10/28/2017, Until Mon 11/04/2017, Print        Note:  This document was prepared using Dragon voice recognition software and may include unintentional dictation errors.  Nanda Quinton, MD Emergency Medicine    Dannon Perlow,  [facility], MD 10/29/17 [phone]

## 2017-10-28 NOTE — ED Triage Notes (Signed)
Pt states he had a lithotripsy procedure done today. Pt states he is now in severe pain from "trying to pass the busted up stones."

## 2017-10-28 NOTE — Discharge Instructions (Signed)
You have been seen in the Emergency Department (ED) today for pain that we believe based on your workup, is caused by kidney stones.  As we have discussed, please drink plenty of fluids.  Please make a follow up appointment with the physician(s) listed elsewhere in this documentation. ° °You may take pain medication as needed but ONLY as prescribed.  Please also take your prescribed Flomax daily.  We also recommend that you take over-the-counter ibuprofen regularly according to label instructions over the next 5 days.  Take it with meals to minimize stomach discomfort. ° °Please see your doctor as soon as possible as stones may take 1-3 weeks to pass and you may require additional care or medications. ° °Return to the Emergency Department (ED) or call your doctor if you have any worsening pain, fever, painful urination, are unable to urinate, or develop other symptoms that concern you. ° ° ° °Kidney Stones °Kidney stones (urolithiasis) are deposits that form inside your kidneys. The intense pain is caused by the stone moving through the urinary tract. When the stone moves, the ureter goes into spasm around the stone. The stone is usually passed in the urine.  °CAUSES  °A disorder that makes certain neck glands produce too much parathyroid hormone (primary hyperparathyroidism). °A buildup of uric acid crystals, similar to gout in your joints. °Narrowing (stricture) of the ureter. °A kidney obstruction present at birth (congenital obstruction). °Previous surgery on the kidney or ureters. °Numerous kidney infections. °SYMPTOMS  °Feeling sick to your stomach (nauseous). °Throwing up (vomiting). °Blood in the urine (hematuria). °Pain that usually spreads (radiates) to the groin. °Frequency or urgency of urination. °DIAGNOSIS  °Taking a history and physical exam. °Blood or urine tests. °CT scan. °Occasionally, an examination of the inside of the urinary bladder (cystoscopy) is performed. °TREATMENT   °Observation. °Increasing your fluid intake. °Extracorporeal shock wave lithotripsy--This is a noninvasive procedure that uses shock waves to break up kidney stones. °Surgery may be needed if you have severe pain or persistent obstruction. There are various surgical procedures. Most of the procedures are performed with the use of small instruments. Only small incisions are needed to accommodate these instruments, so recovery time is minimized. °The size, location, and chemical composition are all important variables that will determine the proper choice of action for you. Talk to your health care provider to better understand your situation so that you will minimize the risk of injury to yourself and your kidney.  °HOME CARE INSTRUCTIONS  °Drink enough water and fluids to keep your urine clear or pale yellow. This will help you to pass the stone or stone fragments. °Strain all urine through the provided strainer. Keep all particulate matter and stones for your health care provider to see. The stone causing the pain may be as small as a grain of salt. It is very important to use the strainer each and every time you pass your urine. The collection of your stone will allow your health care provider to analyze it and verify that a stone has actually passed. The stone analysis will often identify what you can do to reduce the incidence of recurrences. °Only take over-the-counter or prescription medicines for pain, discomfort, or fever as directed by your health care provider. °Keep all follow-up visits as told by your health care provider. This is important. °Get follow-up X-rays if required. The absence of pain does not always mean that the stone has passed. It may have only stopped moving. If the urine remains completely   obstructed, it can cause loss of kidney function or even complete destruction of the kidney. It is your responsibility to make sure X-rays and follow-ups are completed. Ultrasounds of the kidney can  show blockages and the status of the kidney. Ultrasounds are not associated with any radiation and can be performed easily in a matter of minutes. °Make changes to your daily diet as told by your health care provider. You may be told to: °Limit the amount of salt that you eat. °Eat 5 or more servings of fruits and vegetables each day. °Limit the amount of meat, poultry, fish, and eggs that you eat. °Collect a 24-hour urine sample as told by your health care provider. You may need to collect another urine sample every 6-12 months. °SEEK MEDICAL CARE IF: °You experience pain that is progressive and unresponsive to any pain medicine you have been prescribed. °SEEK IMMEDIATE MEDICAL CARE IF:  °Pain cannot be controlled with the prescribed medicine. °You have a fever or shaking chills. °The severity or intensity of pain increases over 18 hours and is not relieved by pain medicine. °You develop a new onset of abdominal pain. °You feel faint or pass out. °You are unable to urinate. °  °This information is not intended to replace advice given to you by your health care provider. Make sure you discuss any questions you have with your health care provider. °  °Document Released: 04/30/2005 Document Revised: 01/19/2015 Document Reviewed: 10/01/2012 °Elsevier Interactive Patient Education ©2016 [employer] ° ° °

## 2017-10-29 ENCOUNTER — Encounter (HOSPITAL_COMMUNITY): Payer: Self-pay | Admitting: Urology

## 2017-10-31 ENCOUNTER — Ambulatory Visit (HOSPITAL_COMMUNITY)
Admission: RE | Admit: 2017-10-31 | Discharge: 2017-10-31 | Disposition: A | Discharge status: Home / Self Care | Payer: Commercial Managed Care - PPO | Source: Ambulatory Visit | Attending: Pulmonary Disease | Admitting: Pulmonary Disease

## 2017-10-31 DIAGNOSIS — R911 Solitary pulmonary nodule: Secondary | ICD-10-CM | POA: Diagnosis present

## 2017-11-13 ENCOUNTER — Ambulatory Visit (INDEPENDENT_AMBULATORY_CARE_PROVIDER_SITE_OTHER): Payer: Self-pay | Admitting: Urology

## 2017-11-13 ENCOUNTER — Other Ambulatory Visit (HOSPITAL_COMMUNITY)
Admission: AD | Admit: 2017-11-13 | Discharge: 2017-11-13 | Disposition: A | Discharge status: Home / Self Care | Payer: Commercial Managed Care - PPO | Source: Skilled Nursing Facility | Attending: *Deleted | Admitting: *Deleted

## 2017-11-13 ENCOUNTER — Ambulatory Visit (HOSPITAL_COMMUNITY)
Admission: RE | Admit: 2017-11-13 | Discharge: 2017-11-13 | Disposition: A | Discharge status: Home / Self Care | Payer: Commercial Managed Care - PPO | Source: Ambulatory Visit | Attending: Urology | Admitting: Urology

## 2017-11-13 ENCOUNTER — Other Ambulatory Visit: Payer: Self-pay | Admitting: Urology

## 2017-11-13 DIAGNOSIS — Z87442 Personal history of urinary calculi: Secondary | ICD-10-CM | POA: Diagnosis not present

## 2017-11-13 DIAGNOSIS — N200 Calculus of kidney: Secondary | ICD-10-CM

## 2017-11-13 DIAGNOSIS — Z09 Encounter for follow-up examination after completed treatment for conditions other than malignant neoplasm: Secondary | ICD-10-CM | POA: Insufficient documentation

## 2017-11-21 LAB — STONE ANALYSIS
Ca Oxalate,Dihydrate: 3 %
Ca Oxalate,Monohydr.: 92 %
Ca phos cry stone ql IR: 5 %
STONE WEIGHT KSTONE: 36 mg

## 2018-01-10 ENCOUNTER — Other Ambulatory Visit: Payer: Self-pay | Admitting: Urology

## 2018-01-10 DIAGNOSIS — N200 Calculus of kidney: Secondary | ICD-10-CM

## 2018-01-20 ENCOUNTER — Ambulatory Visit (HOSPITAL_COMMUNITY)
Admission: RE | Admit: 2018-01-20 | Discharge: 2018-01-20 | Disposition: A | Discharge status: Home / Self Care | Payer: Commercial Managed Care - PPO | Source: Ambulatory Visit | Attending: Urology | Admitting: Urology

## 2018-01-20 DIAGNOSIS — N200 Calculus of kidney: Secondary | ICD-10-CM | POA: Diagnosis not present

## 2018-01-22 ENCOUNTER — Ambulatory Visit (INDEPENDENT_AMBULATORY_CARE_PROVIDER_SITE_OTHER): Payer: Commercial Managed Care - PPO | Admitting: Urology

## 2018-01-22 DIAGNOSIS — N200 Calculus of kidney: Secondary | ICD-10-CM | POA: Diagnosis not present

## 2018-04-14 ENCOUNTER — Other Ambulatory Visit (HOSPITAL_COMMUNITY): Payer: Self-pay | Admitting: Pulmonary Disease

## 2018-04-14 DIAGNOSIS — R911 Solitary pulmonary nodule: Secondary | ICD-10-CM

## 2018-04-28 ENCOUNTER — Ambulatory Visit (HOSPITAL_COMMUNITY): Payer: Commercial Managed Care - PPO

## 2018-04-28 ENCOUNTER — Ambulatory Visit (HOSPITAL_COMMUNITY)
Admission: RE | Admit: 2018-04-28 | Discharge: 2018-04-28 | Disposition: A | Discharge status: Home / Self Care | Payer: Commercial Managed Care - PPO | Source: Ambulatory Visit | Attending: Pulmonary Disease | Admitting: Pulmonary Disease

## 2018-04-28 DIAGNOSIS — R911 Solitary pulmonary nodule: Secondary | ICD-10-CM | POA: Diagnosis not present

## 2018-05-13 ENCOUNTER — Ambulatory Visit (HOSPITAL_COMMUNITY): Payer: Commercial Managed Care - PPO

## 2018-12-29 ENCOUNTER — Other Ambulatory Visit (HOSPITAL_COMMUNITY): Payer: Self-pay | Admitting: Urology

## 2018-12-29 ENCOUNTER — Other Ambulatory Visit: Payer: Self-pay | Admitting: Urology

## 2018-12-29 DIAGNOSIS — N200 Calculus of kidney: Secondary | ICD-10-CM

## 2019-01-05 ENCOUNTER — Ambulatory Visit (HOSPITAL_COMMUNITY)
Admission: RE | Admit: 2019-01-05 | Discharge: 2019-01-05 | Disposition: A | Discharge status: Home / Self Care | Payer: Commercial Managed Care - PPO | Source: Ambulatory Visit | Attending: Urology | Admitting: Urology

## 2019-01-05 ENCOUNTER — Other Ambulatory Visit: Payer: Self-pay

## 2019-01-05 DIAGNOSIS — N200 Calculus of kidney: Secondary | ICD-10-CM | POA: Diagnosis not present

## 2019-01-14 ENCOUNTER — Ambulatory Visit (INDEPENDENT_AMBULATORY_CARE_PROVIDER_SITE_OTHER): Payer: Commercial Managed Care - PPO | Admitting: Urology

## 2019-01-14 ENCOUNTER — Ambulatory Visit: Payer: Commercial Managed Care - PPO | Admitting: Urology

## 2019-01-14 DIAGNOSIS — N200 Calculus of kidney: Secondary | ICD-10-CM | POA: Diagnosis not present

## 2019-03-21 IMAGING — CT CT RENAL STONE PROTOCOL
2 of 4 series · 16 of 46 positions shown, 18 images · non-contrast
Comparison: CT abdomen dated 12/30/2004.

CLINICAL DATA: Flank pain.

EXAM:
CT ABDOMEN AND PELVIS WITHOUT CONTRAST
TECHNIQUE: Multidetector CT imaging of the abdomen and pelvis was performed
following the standard protocol without IV contrast.

[Series 2: axial st · axial · 0.79mm/px · z∈[+755,+1255]mm · 13 of 110 slices shown, 15 images]
[im 5/110  soft-tissue]
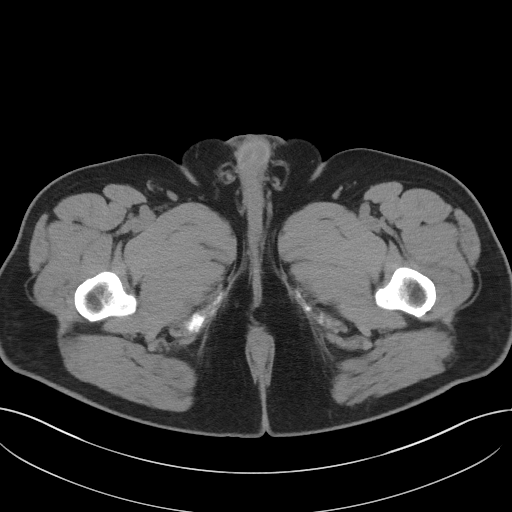
[im 5/110  bone]
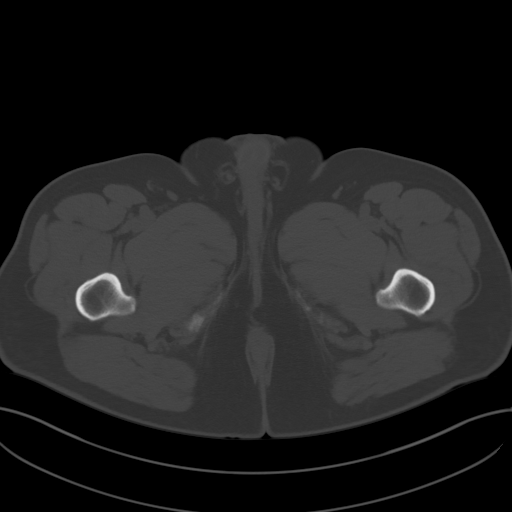
[im 14/110  soft-tissue]
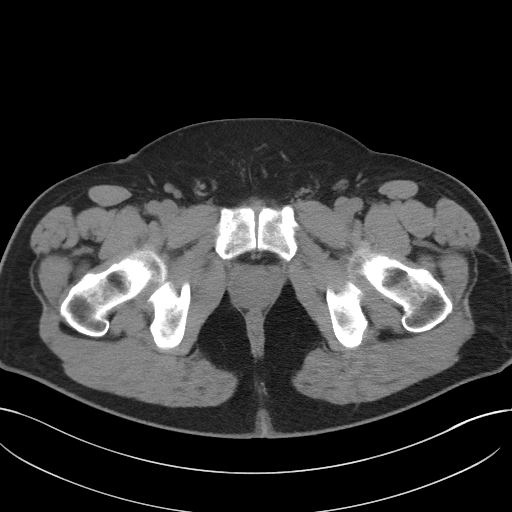
[im 22/110  soft-tissue]
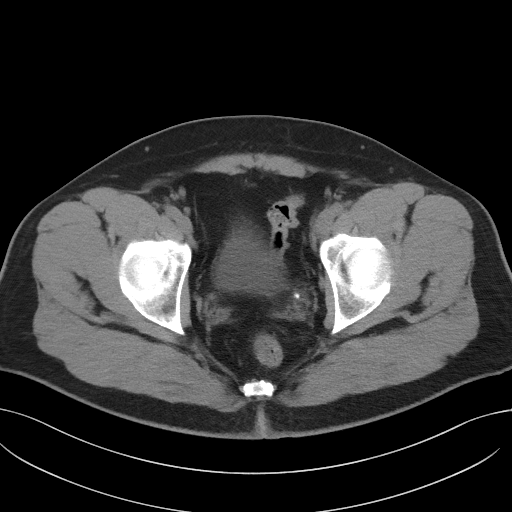
[im 31/110  soft-tissue]
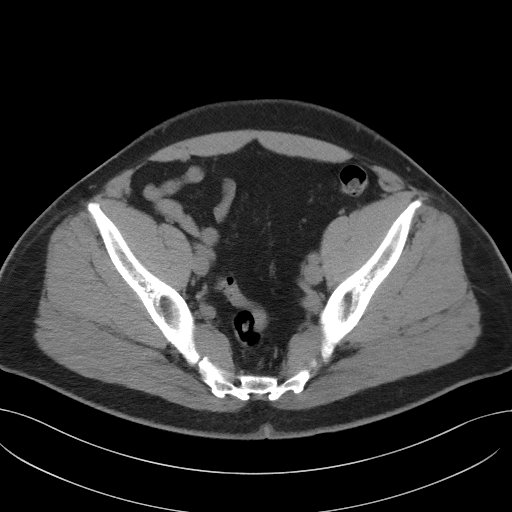
[im 40/110  soft-tissue]
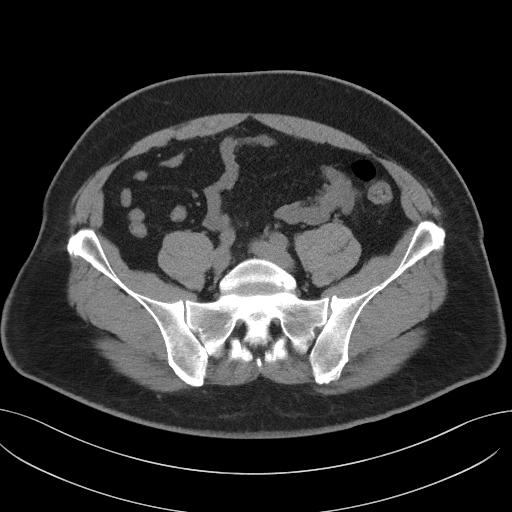
[im 48/110  soft-tissue]
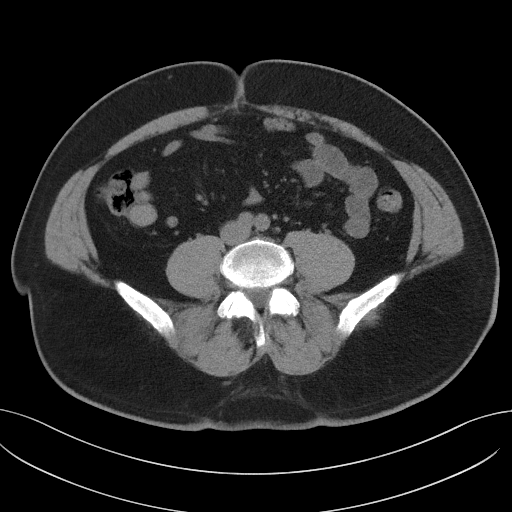
[im 57/110  soft-tissue]
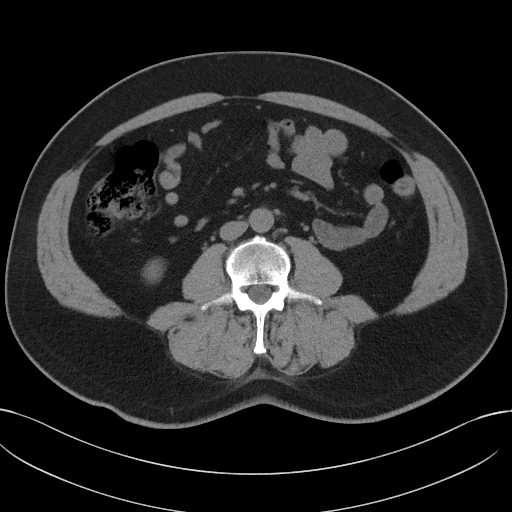
[im 62/110  soft-tissue]
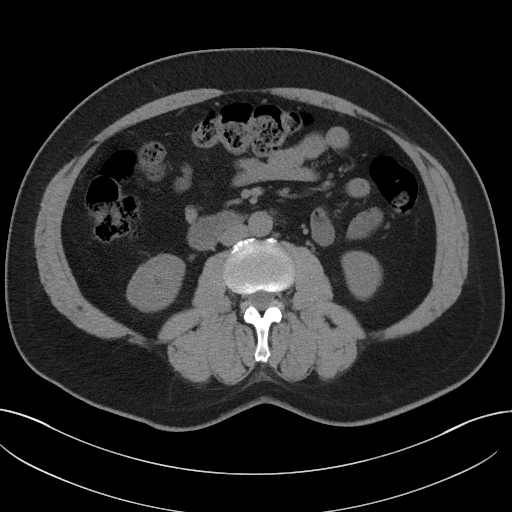
[im 70/110  soft-tissue]
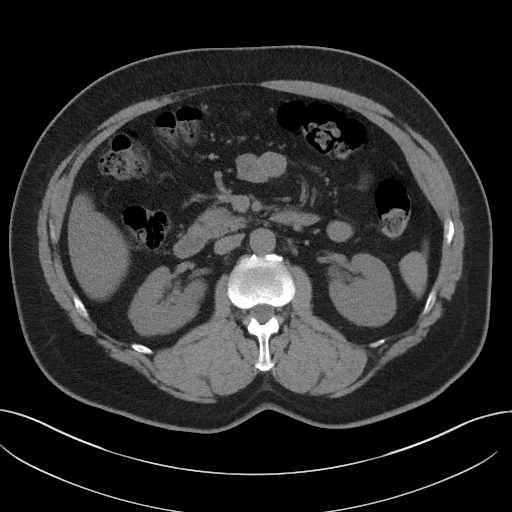
[im 70/110  bone]
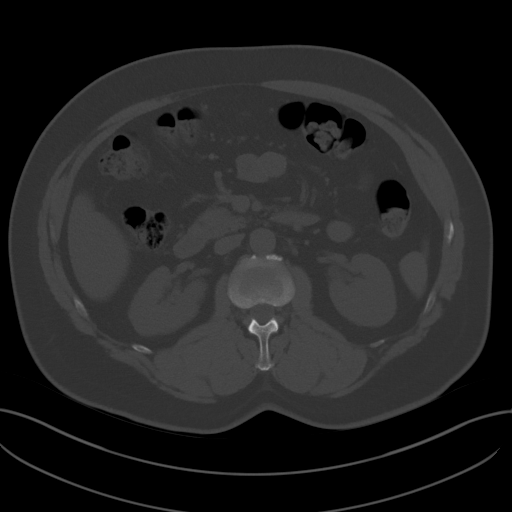
[im 79/110  soft-tissue]
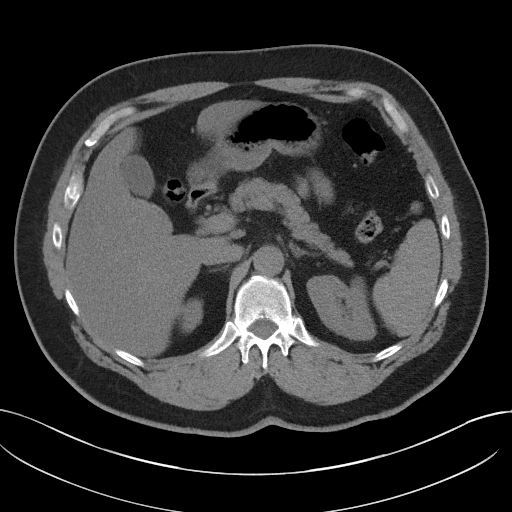
[im 88/110  soft-tissue]
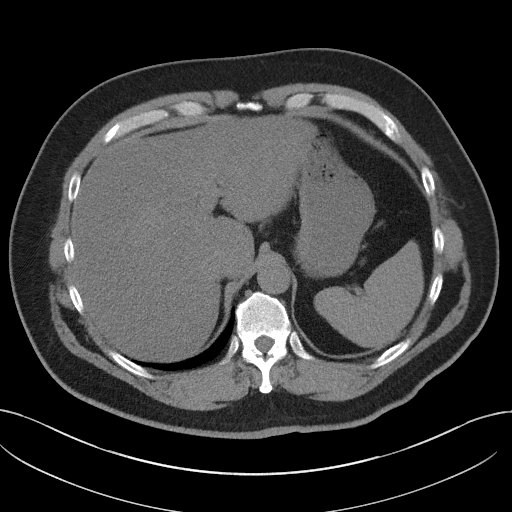
[im 96/110  soft-tissue]
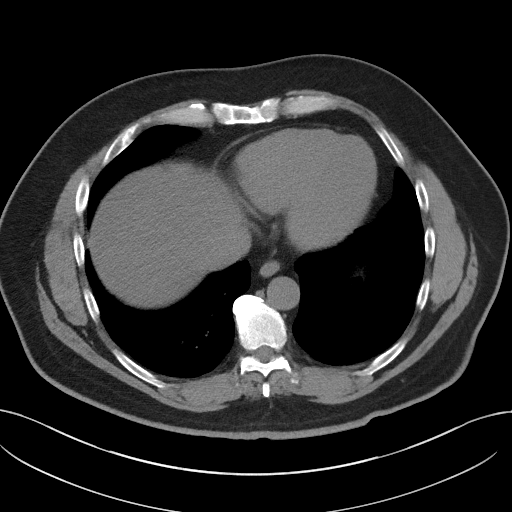
[im 105/110  soft-tissue]
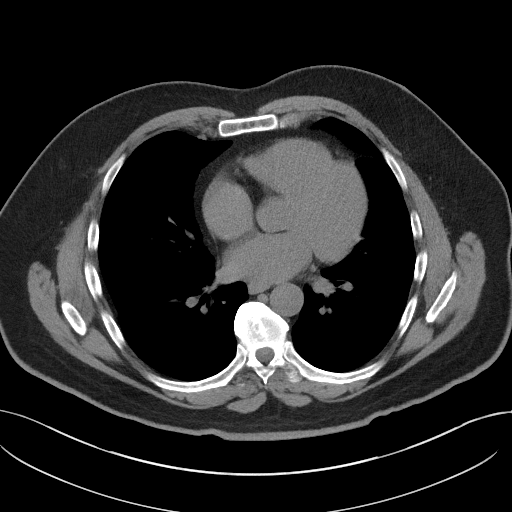

[Series 5: coronal st · coronal · 0.92mm/px · 3 of 112 slices shown]
[im 38/112  soft-tissue]
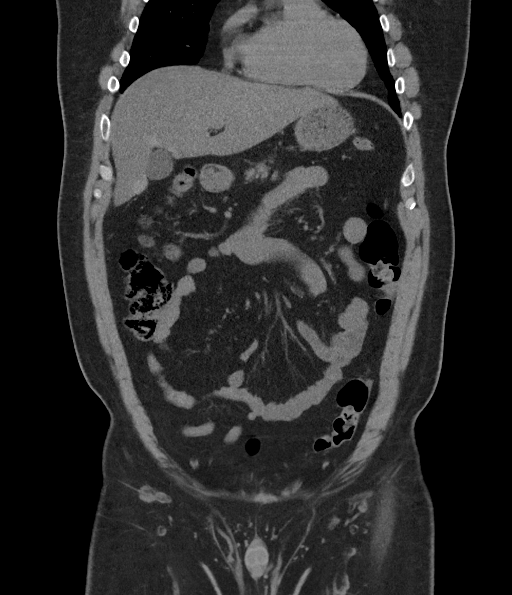
[im 50/112  soft-tissue]
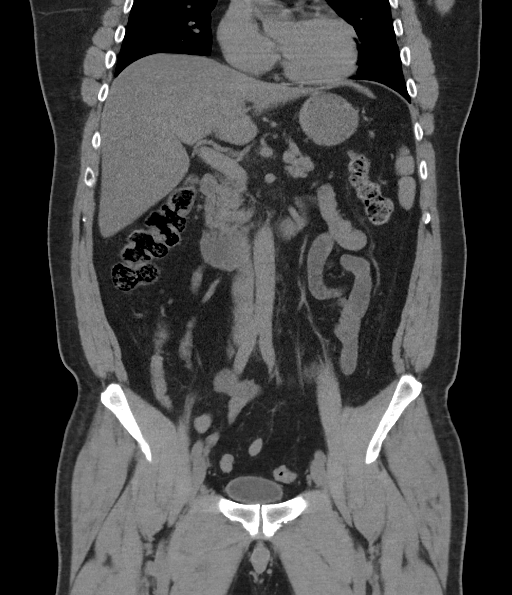
[im 62/112  soft-tissue]
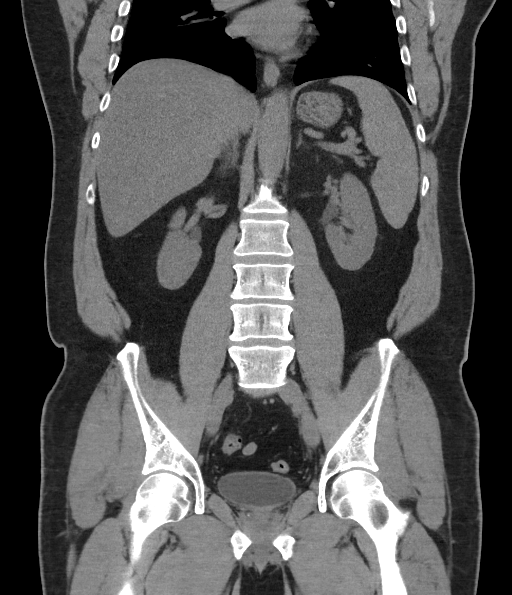

[16 of 46 positions shown; findings below may reference images not displayed]

FINDINGS: Lower chest: 5 mm nodule within the right lower lobe. Lung bases
otherwise clear.

Hepatobiliary: Liver is low in density indicating fatty
infiltration. Gallbladder appears normal. No bile duct dilatation.

Pancreas: Unremarkable. No pancreatic ductal dilatation or
surrounding inflammatory changes.

Spleen: Normal in size without focal abnormality.

Adrenals/Urinary Tract: 3 mm stone within the distal left ureter
causing mild left-sided hydronephrosis.

Additional 2 mm left renal stone. 4 mm nonobstructing right renal
stone. Bladder is unremarkable, partially decompressed.

Stomach/Bowel: Bowel is normal in caliber. No bowel wall thickening
or evidence of bowel wall inflammation seen. Status post
appendectomy. Stomach is unremarkable.

Vascular/Lymphatic: No significant vascular findings are present. No
enlarged abdominal or pelvic lymph nodes.

Reproductive: Prostate is unremarkable.

Other: No abdominal wall hernia or abnormality. No abdominopelvic
ascites.

Musculoskeletal: Mild degenerative change in the lower lumbar spine.
No acute or suspicious osseous finding.
IMPRESSION: 1. 3 mm stone within the distal left ureter, just proximal to the
left UVJ, causing mild left-sided hydronephrosis.
2. Bilateral nephrolithiasis.
3. 5 mm pulmonary nodule within the right lower lobe. No follow-up
needed if patient is low-risk. Non-contrast chest CT can be
considered in 12 months if patient is high-risk. This recommendation
follows the consensus statement: Guidelines for Management of
Incidental Pulmonary Nodules Detected on CT Images: From the
4. Fatty infiltration of the liver.

## 2019-04-08 ENCOUNTER — Other Ambulatory Visit: Payer: Self-pay | Admitting: Pulmonary Disease

## 2019-04-08 ENCOUNTER — Other Ambulatory Visit (HOSPITAL_COMMUNITY): Payer: Self-pay | Admitting: Pulmonary Disease

## 2019-04-08 DIAGNOSIS — R911 Solitary pulmonary nodule: Secondary | ICD-10-CM

## 2019-04-17 ENCOUNTER — Ambulatory Visit (HOSPITAL_COMMUNITY): Payer: Commercial Managed Care - PPO

## 2019-04-23 ENCOUNTER — Other Ambulatory Visit: Payer: Self-pay

## 2019-04-23 ENCOUNTER — Ambulatory Visit (HOSPITAL_COMMUNITY)
Admission: RE | Admit: 2019-04-23 | Discharge: 2019-04-23 | Disposition: A | Discharge status: Home / Self Care | Payer: Commercial Managed Care - PPO | Source: Ambulatory Visit | Attending: Pulmonary Disease | Admitting: Pulmonary Disease

## 2019-04-23 DIAGNOSIS — R911 Solitary pulmonary nodule: Secondary | ICD-10-CM | POA: Diagnosis not present

## 2019-05-19 ENCOUNTER — Other Ambulatory Visit: Payer: Self-pay

## 2019-05-19 DIAGNOSIS — N200 Calculus of kidney: Secondary | ICD-10-CM

## 2019-11-02 ENCOUNTER — Encounter: Payer: Self-pay | Admitting: Gastroenterology

## 2019-12-17 ENCOUNTER — Other Ambulatory Visit: Payer: Self-pay

## 2019-12-17 ENCOUNTER — Ambulatory Visit (AMBULATORY_SURGERY_CENTER): Payer: Self-pay

## 2019-12-17 VITALS — Ht 70.0 in | Wt 228.4 lb

## 2019-12-17 DIAGNOSIS — Z800 Family history of malignant neoplasm of digestive organs: Secondary | ICD-10-CM

## 2019-12-17 DIAGNOSIS — Z86010 Personal history of colonic polyps: Secondary | ICD-10-CM

## 2019-12-17 MED ORDER — SUTAB 1479-225-188 MG PO TABS: 12.0000 | ORAL_TABLET | ORAL | 0 refills | Status: AC

## 2019-12-17 NOTE — Progress Notes (Signed)
No allergies to soy or egg Pt is not on blood thinners or diet pills Denies issues with sedation/intubation Denies atrial flutter/fib Denies constipation   Pt is aware of Covid safety and care partner requirements.      

## 2019-12-18 ENCOUNTER — Encounter: Payer: Self-pay | Admitting: Gastroenterology

## 2019-12-29 ENCOUNTER — Ambulatory Visit (INDEPENDENT_AMBULATORY_CARE_PROVIDER_SITE_OTHER): Payer: Commercial Managed Care - PPO

## 2019-12-29 ENCOUNTER — Other Ambulatory Visit: Payer: Self-pay | Admitting: Gastroenterology

## 2019-12-29 ENCOUNTER — Other Ambulatory Visit: Payer: Self-pay

## 2019-12-29 DIAGNOSIS — Z1159 Encounter for screening for other viral diseases: Secondary | ICD-10-CM

## 2019-12-30 LAB — SARS CORONAVIRUS 2 (TAT 6-24 HRS): SARS Coronavirus 2: NEGATIVE

## 2019-12-31 ENCOUNTER — Other Ambulatory Visit: Payer: Self-pay

## 2019-12-31 ENCOUNTER — Ambulatory Visit (AMBULATORY_SURGERY_CENTER): Payer: Commercial Managed Care - PPO | Admitting: Gastroenterology

## 2019-12-31 ENCOUNTER — Encounter: Payer: Self-pay | Admitting: Gastroenterology

## 2019-12-31 VITALS — BP 120/79 | HR 56 | Temp 97.3°F | Resp 12 | Ht 70.0 in | Wt 228.4 lb

## 2019-12-31 DIAGNOSIS — D123 Benign neoplasm of transverse colon: Secondary | ICD-10-CM

## 2019-12-31 DIAGNOSIS — D122 Benign neoplasm of ascending colon: Secondary | ICD-10-CM

## 2019-12-31 DIAGNOSIS — D124 Benign neoplasm of descending colon: Secondary | ICD-10-CM | POA: Diagnosis not present

## 2019-12-31 DIAGNOSIS — Z800 Family history of malignant neoplasm of digestive organs: Secondary | ICD-10-CM | POA: Diagnosis not present

## 2019-12-31 DIAGNOSIS — Z86010 Personal history of colonic polyps: Secondary | ICD-10-CM

## 2019-12-31 MED ORDER — SODIUM CHLORIDE 0.9 % IV SOLN: 500.0000 mL | Freq: Once | INTRAVENOUS | Status: AC

## 2019-12-31 NOTE — Progress Notes (Signed)
Called to room to assist during endoscopic procedure.  Patient ID and intended procedure confirmed with present staff. Received instructions for my participation in the procedure from the performing physician.  

## 2019-12-31 NOTE — Op Note (Signed)
[facility] [place] Patient Name: Jackson Carpenter Procedure Date: 12/31/2019 1:24 PM MRN: [id number] Endoscopist: Remo Lipps P. Havery Moros , MD Age: 54 Referring MD:  Date of Birth: Mar 28, 1966 Gender: Male Account #: [id number] Procedure:                Colonoscopy Indications:              High risk colon cancer surveillance: Personal                            history of colonic polyps (6 polyps removed 2018,                            mother with colon cancer age 17s) Medicines:                Monitored Anesthesia Care Procedure:                Pre-Anesthesia Assessment:                           - Prior to the procedure, a History and Physical                            was performed, and patient medications and                            allergies were reviewed. The patient's tolerance of                            previous anesthesia was also reviewed. The risks                            and benefits of the procedure and the sedation                            options and risks were discussed with the patient.                            All questions were answered, and informed consent                            was obtained. Prior Anticoagulants: The patient has                            taken no previous anticoagulant or antiplatelet                            agents. ASA Grade Assessment: I - A normal, healthy                            patient. After reviewing the risks and benefits,                            the patient was deemed in satisfactory condition to  undergo the procedure.                           After obtaining informed consent, the colonoscope                            was passed under direct vision. Throughout the                            procedure, the patient's blood pressure, pulse, and                            oxygen saturations were monitored continuously. The                            Colonoscope was introduced through  the anus and                            advanced to the the cecum, identified by                            appendiceal orifice and ileocecal valve. The                            colonoscopy was performed without difficulty. The                            patient tolerated the procedure well. The quality                            of the bowel preparation was good. The ileocecal                            valve, appendiceal orifice, and rectum were                            photographed. Scope In: 1:29:04 PM Scope Out: 1:45:51 PM Scope Withdrawal Time: 0 hours 12 minutes 55 seconds  Total Procedure Duration: 0 hours 16 minutes 47 seconds  Findings:                 The perianal and digital rectal examinations were                            normal.                           A 1 to 2 mm polyp was found in the ascending colon.                            The polyp was sessile. The polyp was removed with a                            cold snare. Resection and retrieval were complete.  A 4 mm polyp was found in the transverse colon. The                            polyp was sessile. The polyp was removed with a                            cold snare. Resection and retrieval were complete.                           A 3 mm polyp was found in the descending colon. The                            polyp was sessile. The polyp was removed with a                            cold snare. Resection and retrieval were complete.                           Internal hemorrhoids were found during                            retroflexion. The hemorrhoids were small.                           The exam was otherwise without abnormality. Complications:            No immediate complications. Estimated blood loss:                            Minimal. Estimated Blood Loss:     Estimated blood loss was minimal. Impression:               - One 1 to 2 mm polyp in the ascending colon,                             removed with a cold snare. Resected and retrieved.                           - One 4 mm polyp in the transverse colon, removed                            with a cold snare. Resected and retrieved.                           - One 3 mm polyp in the descending colon, removed                            with a cold snare. Resected and retrieved.                           - Internal hemorrhoids.                           - The  examination was otherwise normal. Recommendation:           - Patient has a contact number available for                            emergencies. The signs and symptoms of potential                            delayed complications were discussed with the                            patient. Return to normal activities tomorrow.                            Written discharge instructions were provided to the                            patient.                           - Resume previous diet.                           - Continue present medications.                           - Await pathology results with further                            recommendations. Remo Lipps P. Kaveh Kissinger, MD 12/31/2019 1:49:40 PM This report has been signed electronically.

## 2019-12-31 NOTE — Progress Notes (Signed)
Patients history reviewed. VS: C.W

## 2019-12-31 NOTE — Patient Instructions (Signed)
Impression/Recommendations:  Polyp handout given to patient. Hemorrhoid handout given to patient.  Resume previous diet. Continue present medications. Await pathology results.  YOU HAD AN ENDOSCOPIC PROCEDURE TODAY AT [facility]:   Refer to the procedure report that was given to you for any specific questions about what was found during the examination.  If the procedure report does not answer your questions, please call your gastroenterologist to clarify.  If you requested that your care partner not be given the details of your procedure findings, then the procedure report has been included in a sealed envelope for you to review at your convenience later.  YOU SHOULD EXPECT: Some feelings of bloating in the abdomen. Passage of more gas than usual.  Walking can help get rid of the air that was put into your GI tract during the procedure and reduce the bloating. If you had a lower endoscopy (such as a colonoscopy or flexible sigmoidoscopy) you may notice spotting of blood in your stool or on the toilet paper. If you underwent a bowel prep for your procedure, you may not have a normal bowel movement for a few days.  Please Note:  You might notice some irritation and congestion in your nose or some drainage.  This is from the oxygen used during your procedure.  There is no need for concern and it should clear up in a day or so.  SYMPTOMS TO REPORT IMMEDIATELY:   Following lower endoscopy (colonoscopy or flexible sigmoidoscopy):  Excessive amounts of blood in the stool  Significant tenderness or worsening of abdominal pains  Swelling of the abdomen that is new, acute  Fever of 100F or higher For urgent or emergent issues, a gastroenterologist can be reached at any hour by calling [phone]. Do not use MyChart messaging for urgent concerns.    DIET:  We do recommend a small meal at first, but then you may proceed to your regular diet.  Drink plenty of fluids but you  should avoid alcoholic beverages for 24 hours.  ACTIVITY:  You should plan to take it easy for the rest of today and you should NOT DRIVE or use heavy machinery until tomorrow (because of the sedation medicines used during the test).    FOLLOW UP: Our staff will call the number listed on your records 48-72 hours following your procedure to check on you and address any questions or concerns that you may have regarding the information given to you following your procedure. If we do not reach you, we will leave a message.  We will attempt to reach you two times.  During this call, we will ask if you have developed any symptoms of COVID 19. If you develop any symptoms (ie: fever, flu-like symptoms, shortness of breath, cough etc.) before then, please call [phone].  If you test positive for Covid 19 in the 2 weeks post procedure, please call and report this information to [place].    If any biopsies were taken you will be contacted by phone or by letter within the next 1-3 weeks.  Please call us at [phone] if you have not heard about the biopsies in 3 weeks.    SIGNATURES/CONFIDENTIALITY: You and/or your care partner have signed paperwork which will be entered into your electronic medical record.  These signatures attest to the fact that that the information above on your After Visit Summary has been reviewed and is understood.  Full responsibility of the confidentiality of this discharge information lies with you  and/or your care-partner. 

## 2019-12-31 NOTE — Progress Notes (Signed)
Report given to PACU, vss 

## 2020-01-04 ENCOUNTER — Telehealth: Payer: Self-pay | Admitting: *Deleted

## 2020-01-04 NOTE — Telephone Encounter (Signed)
  Follow up Call-  Call back number 12/31/2019  Post procedure Call Back phone  # [phone]  Permission to leave phone message Yes  Some recent data might be hidden     Patient questions:  Do you have a fever, pain , or abdominal swelling? No. Pain Score  0 *  Have you tolerated food without any problems? Yes.    Have you been able to return to your normal activities? Yes.    Do you have any questions about your discharge instructions: Diet   No. Medications  No. Follow up visit  No.  Do you have questions or concerns about your Care? No.  Actions: * If pain score is 4 or above: 1. No action needed, pain <4.Have you developed a fever since your procedure? no  2.   Have you had an respiratory symptoms (SOB or cough) since your procedure? no  3.   Have you tested positive for COVID 19 since your procedure no  4.   Have you had any family members/close contacts diagnosed with the COVID 19 since your procedure?  no   If yes to any of these questions please route to Joylene John, RN and Joella Prince, RN

## 2020-01-08 ENCOUNTER — Encounter: Payer: Self-pay | Admitting: Gastroenterology

## 2020-02-20 ENCOUNTER — Encounter: Payer: Self-pay | Admitting: Emergency Medicine

## 2020-02-20 ENCOUNTER — Ambulatory Visit
Admission: EM | Admit: 2020-02-20 | Discharge: 2020-02-20 | Disposition: A | Discharge status: Home / Self Care | Payer: Commercial Managed Care - PPO | Attending: Emergency Medicine | Admitting: Emergency Medicine

## 2020-02-20 ENCOUNTER — Other Ambulatory Visit: Payer: Self-pay

## 2020-02-20 DIAGNOSIS — R0989 Other specified symptoms and signs involving the circulatory and respiratory systems: Secondary | ICD-10-CM

## 2020-02-20 DIAGNOSIS — R6889 Other general symptoms and signs: Secondary | ICD-10-CM

## 2020-02-20 MED ORDER — PREDNISONE 10 MG PO TABS: 20.0000 mg | ORAL_TABLET | Freq: Every day | ORAL | 0 refills | Status: AC

## 2020-02-20 MED ORDER — ALBUTEROL SULFATE HFA 108 (90 BASE) MCG/ACT IN AERS: 1.0000 | INHALATION_SPRAY | Freq: Four times a day (QID) | RESPIRATORY_TRACT | 0 refills | Status: AC | PRN

## 2020-02-20 NOTE — Discharge Instructions (Addendum)
Get plenty of rest and push fluids Albuterol was prescribed Prednisone was prescribed take as directed Get OTC Mucinex for congestion Use OTC medications like ibuprofen or tylenol as needed fever or pain Call or go to the ED if you have any new or worsening symptoms such as fever, worsening cough, shortness of breath, chest tightness, chest pain, turning blue, changes in mental status, etc..

## 2020-02-20 NOTE — ED Provider Notes (Signed)
[facility]   [id number] 02/20/20 Arrival Time: 5643  Chief Complaint  Patient presents with  . Sore Throat     SUBJECTIVE: History from: patient.  Jackson Carpenter is a 54 y.o. male who presents to the urgent care for complaint of sore throat that started this past Wednesday.  Patient report history is no longer sore but is clogged or congested.  Denies sick exposure to COVID, flu or strep.  Denies recent travel.  Has tried OTC medication without relief.  Denies aggravating symptoms.  Denies previous symptoms in the past.   Denies fever, chills, fatigue, sinus pain, rhinorrhea, sore throat, SOB, wheezing, chest pain, nausea, changes in bowel or bladder habits.  .  ROS: As per HPI.  All other pertinent ROS negative.     Past Medical History:  Diagnosis Date  . Adenomatous colon polyp 2012  . Arthritis    all over  . Heart murmur    history of  . History of kidney stones   . Kidney stone    Past Surgical History:  Procedure Laterality Date  . APPENDECTOMY    . COLONOSCOPY  2018  . EXTRACORPOREAL SHOCK WAVE LITHOTRIPSY Right 10/28/2017   Procedure: EXTRACORPOREAL SHOCK WAVE LITHOTRIPSY (ESWL);  Surgeon: Cleon Gustin, MD;  Location: [facility];  Service: Urology;  Laterality: Right;  [phone] [id number]  . KNEE ARTHROSCOPY     left  . POLYPECTOMY    . WISDOM TOOTH EXTRACTION     No Known Allergies Current Facility-Administered Medications on File Prior to Encounter  Medication Dose Route Frequency Provider Last Rate Last Admin  . 0.9 %  sodium chloride infusion  500 mL Intravenous Continuous Armbruster, Carlota Raspberry, MD      . 0.9 %  sodium chloride infusion  500 mL Intravenous Once Armbruster, Carlota Raspberry, MD       Current Outpatient Medications on File Prior to Encounter  Medication Sig Dispense Refill  . Aspirin-Acetaminophen-Caffeine (GOODY HEADACHE PO) Take by mouth. As needed for headache     . clobetasol cream (TEMOVATE) 0.05 % Apply topically.    Marland Kitchen  ketorolac (TORADOL) 10 MG tablet Take 1 tablet (10 mg total) by mouth every 6 (six) hours as needed. (Patient not taking: Reported on 12/17/2019) 20 tablet 0  . ondansetron (ZOFRAN ODT) 4 MG disintegrating tablet Take 1 tablet (4 mg total) by mouth every 8 (eight) hours as needed for nausea or vomiting. (Patient not taking: Reported on 12/17/2019) 20 tablet 0  . Sodium Sulfate-Mag Sulfate-KCl (SUTAB) [phone] MG TABS Take 12 tablets by mouth as directed. BIN: [id number] PCN: CN GROUP: [id number] MEMBER ID: [id number];GGY AS CASH;NO PRIOR AUTHORIZATION 24 tablet 0   Social History   Socioeconomic History  . Marital status: Married    Spouse name: Not on file  . Number of children: Not on file  . Years of education: Not on file  . Highest education level: Not on file  Occupational History  . Not on file  Tobacco Use  . Smoking status: Never Smoker  . Smokeless tobacco: Current User    Types: Chew  Vaping Use  . Vaping Use: Never used  Substance and Sexual Activity  . Alcohol use: No  . Drug use: No  . Sexual activity: Not on file  Other Topics Concern  . Not on file  Social History Narrative  . Not on file   Social Determinants of Health   Financial Resource Strain:   . Difficulty of Paying Living Expenses:  Not on file  Food Insecurity:   . Worried About Charity fundraiser in the Last Year: Not on file  . Ran Out of Food in the Last Year: Not on file  Transportation Needs:   . Lack of Transportation (Medical): Not on file  . Lack of Transportation (Non-Medical): Not on file  Physical Activity:   . Days of Exercise per Week: Not on file  . Minutes of Exercise per Session: Not on file  Stress:   . Feeling of Stress : Not on file  Social Connections:   . Frequency of Communication with Friends and Family: Not on file  . Frequency of Social Gatherings with Friends and Family: Not on file  . Attends Religious Services: Not on file  . Active Member of Clubs or Organizations: Not  on file  . Attends Archivist Meetings: Not on file  . Marital Status: Not on file  Intimate Partner Violence:   . Fear of Current or Ex-Partner: Not on file  . Emotionally Abused: Not on file  . Physically Abused: Not on file  . Sexually Abused: Not on file   Family History  Problem Relation Age of Onset  . Colon cancer Mother   . Colon polyps Father   . Esophageal cancer Neg Hx   . Rectal cancer Neg Hx   . Stomach cancer Neg Hx     OBJECTIVE:  Vitals:   02/20/20 0855 02/20/20 0857  BP: 125/85   Pulse: 60   Resp: 18   Temp: 98.6 F (37 C)   TempSrc: Oral   SpO2: 97%   Weight:  230 lb (104.3 kg)  Height:  5\' 10"  (1.778 m)     General appearance: alert; appears fatigued, but nontoxic; speaking in full sentences and tolerating own secretions HEENT: NCAT; Ears: EACs clear, TMs pearly gray; Eyes: PERRL.  EOM grossly intact. Sinuses: nontender; Nose: nares patent without rhinorrhea, Throat: oropharynx clear, tonsils non erythematous or enlarged, uvula midline  Neck: supple without LAD Lungs: unlabored respirations, symmetrical air entry; cough: absent; no respiratory distress; CTAB Heart: regular rate and rhythm.  Radial pulses 2+ symmetrical bilaterally Skin: warm and dry Psychological: alert and cooperative; normal mood and affect  LABS:  No results found for this or any previous visit (from the past 24 hour(s)).   ASSESSMENT & PLAN:  1. Throat congestion     Meds ordered this encounter  Medications  . albuterol (VENTOLIN HFA) 108 (90 Base) MCG/ACT inhaler    Sig: Inhale 1-2 puffs into the lungs every 6 (six) hours as needed for wheezing or shortness of breath.    Dispense:  18 g    Refill:  0  . predniSONE (DELTASONE) 10 MG tablet    Sig: Take 2 tablets (20 mg total) by mouth daily.    Dispense:  15 tablet    Refill:  0   Discharge instructions  Get plenty of rest and push fluids Albuterol was prescribed Prednisone was prescribed take as  directed Get OTC Mucinex for congestion Use OTC medications like ibuprofen or tylenol as needed fever or pain Call or go to the ED if you have any new or worsening symptoms such as fever, worsening cough, shortness of breath, chest tightness, chest pain, turning blue, changes in mental status, etc...   Reviewed expectations re: course of current medical issues. Questions answered. Outlined signs and symptoms indicating need for more acute intervention. Patient verbalized understanding. After Visit Summary given.  Emerson Monte, FNP 02/20/20 [phone]

## 2020-02-20 NOTE — ED Triage Notes (Addendum)
Pt states he had a sore throat that started Wednesday.  His throat is no longer sore but he feels like it is clogged up and congested. Reports this happened a few years back and was tx with breathing tx at the hospital that helped.

## 2020-07-20 ENCOUNTER — Ambulatory Visit: Payer: Commercial Managed Care - PPO | Admitting: Urology
